# Patient Record
Sex: Female | Born: 1980 | Race: White | Hispanic: No | Marital: Married | Smoking: Former smoker
Health system: Southern US, Community
[De-identification: ages and names within clinical notes are randomized; demographics above are authoritative.]

## PROBLEM LIST (undated history)

## (undated) DIAGNOSIS — Z87442 Personal history of urinary calculi: Secondary | ICD-10-CM

## (undated) DIAGNOSIS — F410 Panic disorder [episodic paroxysmal anxiety] without agoraphobia: Secondary | ICD-10-CM

## (undated) DIAGNOSIS — IMO0002 Reserved for concepts with insufficient information to code with codable children: Secondary | ICD-10-CM

## (undated) DIAGNOSIS — R87619 Unspecified abnormal cytological findings in specimens from cervix uteri: Secondary | ICD-10-CM

## (undated) DIAGNOSIS — Z803 Family history of malignant neoplasm of breast: Secondary | ICD-10-CM

## (undated) DIAGNOSIS — R159 Full incontinence of feces: Secondary | ICD-10-CM

## (undated) HISTORY — DX: Unspecified abnormal cytological findings in specimens from cervix uteri: R87.619

## (undated) HISTORY — DX: Family history of malignant neoplasm of breast: Z80.3

## (undated) HISTORY — PX: THORACIC OUTLET SURGERY: SHX2502

## (undated) HISTORY — DX: Reserved for concepts with insufficient information to code with codable children: IMO0002

## (undated) HISTORY — PX: OTHER SURGICAL HISTORY: SHX169

## (undated) HISTORY — DX: Personal history of urinary calculi: Z87.442

## (undated) HISTORY — DX: Full incontinence of feces: R15.9

## (undated) HISTORY — DX: Panic disorder (episodic paroxysmal anxiety): F41.0

## (undated) HISTORY — PX: SHOULDER SURGERY: SHX246

---

## 2002-10-11 HISTORY — PX: SHOULDER OPEN ROTATOR CUFF REPAIR: SHX2407

## 2004-10-11 HISTORY — PX: OTHER SURGICAL HISTORY: SHX169

## 2009-11-20 ENCOUNTER — Emergency Department (HOSPITAL_COMMUNITY): Admission: EM | Admit: 2009-11-20 | Discharge: 2009-11-20 | Payer: Self-pay | Admitting: Emergency Medicine

## 2010-09-09 ENCOUNTER — Ambulatory Visit: Payer: Self-pay | Admitting: Obstetrics & Gynecology

## 2010-09-09 LAB — CONVERTED CEMR LAB
Antibody Screen: NEGATIVE
Basophils Absolute: 0 10*3/uL (ref 0.0–0.1)
Basophils Relative: 0 % (ref 0–1)
Eosinophils Absolute: 0 10*3/uL (ref 0.0–0.7)
Eosinophils Relative: 0 % (ref 0–5)
HCT: 36.5 % (ref 36.0–46.0)
HIV: NONREACTIVE
Hemoglobin: 12.2 g/dL (ref 12.0–15.0)
Hepatitis B Surface Ag: NEGATIVE
Lymphocytes Relative: 22 % (ref 12–46)
Lymphs Abs: 1.6 10*3/uL (ref 0.7–4.0)
MCHC: 33.4 g/dL (ref 30.0–36.0)
MCV: 92.9 fL (ref 78.0–100.0)
Monocytes Absolute: 0.5 10*3/uL (ref 0.1–1.0)
Monocytes Relative: 7 % (ref 3–12)
Neutro Abs: 4.9 10*3/uL (ref 1.7–7.7)
Neutrophils Relative %: 70 % (ref 43–77)
Platelets: 180 10*3/uL (ref 150–400)
RBC: 3.93 M/uL (ref 3.87–5.11)
RDW: 12.8 % (ref 11.5–15.5)
Rh Type: POSITIVE
Rubella: 27.1 intl units/mL — ABNORMAL HIGH
WBC: 7 10*3/uL (ref 4.0–10.5)

## 2010-09-09 LAB — CBC
HCT: 36 % (ref 36–46)
Hemoglobin: 12.2 g/dL (ref 12.0–16.0)

## 2010-09-16 ENCOUNTER — Ambulatory Visit (HOSPITAL_COMMUNITY)
Admission: RE | Admit: 2010-09-16 | Discharge: 2010-09-16 | Payer: Self-pay | Source: Home / Self Care | Attending: Family Medicine | Admitting: Family Medicine

## 2010-10-01 ENCOUNTER — Ambulatory Visit: Payer: Self-pay | Admitting: Obstetrics & Gynecology

## 2010-10-01 ENCOUNTER — Encounter: Payer: Self-pay | Admitting: Obstetrics & Gynecology

## 2010-10-01 LAB — CONVERTED CEMR LAB: TSH: 1.26 microintl units/mL (ref 0.350–4.500)

## 2010-10-26 ENCOUNTER — Ambulatory Visit (HOSPITAL_COMMUNITY)
Admission: RE | Admit: 2010-10-26 | Discharge: 2010-10-26 | Payer: Self-pay | Source: Home / Self Care | Attending: Family Medicine | Admitting: Family Medicine

## 2010-10-29 ENCOUNTER — Ambulatory Visit
Admission: RE | Admit: 2010-10-29 | Discharge: 2010-10-29 | Payer: Self-pay | Source: Home / Self Care | Attending: Obstetrics and Gynecology | Admitting: Obstetrics and Gynecology

## 2010-10-29 ENCOUNTER — Other Ambulatory Visit (HOSPITAL_COMMUNITY): Payer: Self-pay | Admitting: *Deleted

## 2010-10-29 DIAGNOSIS — Z3689 Encounter for other specified antenatal screening: Secondary | ICD-10-CM

## 2010-11-25 ENCOUNTER — Ambulatory Visit (HOSPITAL_COMMUNITY): Admission: RE | Admit: 2010-11-25 | Payer: Self-pay | Source: Home / Self Care | Admitting: Family Medicine

## 2010-11-25 ENCOUNTER — Ambulatory Visit (HOSPITAL_COMMUNITY)
Admission: RE | Admit: 2010-11-25 | Discharge: 2010-11-25 | Disposition: A | Discharge status: Home / Self Care | Payer: Commercial Managed Care - PPO | Source: Ambulatory Visit | Attending: Family Medicine | Admitting: Family Medicine

## 2010-11-25 DIAGNOSIS — Z363 Encounter for antenatal screening for malformations: Secondary | ICD-10-CM | POA: Insufficient documentation

## 2010-11-25 DIAGNOSIS — Z3689 Encounter for other specified antenatal screening: Secondary | ICD-10-CM

## 2010-11-25 DIAGNOSIS — O358XX0 Maternal care for other (suspected) fetal abnormality and damage, not applicable or unspecified: Secondary | ICD-10-CM | POA: Insufficient documentation

## 2010-11-25 DIAGNOSIS — Z1389 Encounter for screening for other disorder: Secondary | ICD-10-CM | POA: Insufficient documentation

## 2010-11-26 ENCOUNTER — Encounter: Payer: Commercial Managed Care - PPO | Admitting: Obstetrics & Gynecology

## 2010-11-26 DIAGNOSIS — Z3480 Encounter for supervision of other normal pregnancy, unspecified trimester: Secondary | ICD-10-CM

## 2010-12-23 ENCOUNTER — Encounter: Payer: Commercial Managed Care - PPO | Admitting: Obstetrics & Gynecology

## 2010-12-23 DIAGNOSIS — Z3480 Encounter for supervision of other normal pregnancy, unspecified trimester: Secondary | ICD-10-CM

## 2011-01-19 ENCOUNTER — Encounter: Payer: Commercial Managed Care - PPO | Admitting: Obstetrics and Gynecology

## 2011-01-19 DIAGNOSIS — Z3480 Encounter for supervision of other normal pregnancy, unspecified trimester: Secondary | ICD-10-CM

## 2011-01-19 DIAGNOSIS — O99340 Other mental disorders complicating pregnancy, unspecified trimester: Secondary | ICD-10-CM

## 2011-02-09 ENCOUNTER — Encounter (INDEPENDENT_AMBULATORY_CARE_PROVIDER_SITE_OTHER): Payer: 59 | Admitting: Obstetrics and Gynecology

## 2011-02-09 DIAGNOSIS — Z3480 Encounter for supervision of other normal pregnancy, unspecified trimester: Secondary | ICD-10-CM

## 2011-02-09 DIAGNOSIS — Z87442 Personal history of urinary calculi: Secondary | ICD-10-CM

## 2011-02-09 DIAGNOSIS — O99340 Other mental disorders complicating pregnancy, unspecified trimester: Secondary | ICD-10-CM

## 2011-02-09 HISTORY — DX: Personal history of urinary calculi: Z87.442

## 2011-02-10 ENCOUNTER — Encounter: Payer: Commercial Managed Care - PPO | Admitting: Obstetrics & Gynecology

## 2011-02-15 ENCOUNTER — Inpatient Hospital Stay (HOSPITAL_COMMUNITY)
Admission: AD | Admit: 2011-02-15 | Discharge: 2011-02-15 | Disposition: A | Discharge status: Home / Self Care | Payer: 59 | Source: Ambulatory Visit | Attending: Obstetrics and Gynecology | Admitting: Obstetrics and Gynecology

## 2011-02-15 ENCOUNTER — Encounter (HOSPITAL_COMMUNITY): Payer: Self-pay | Admitting: Radiology

## 2011-02-15 ENCOUNTER — Inpatient Hospital Stay (HOSPITAL_COMMUNITY): Payer: 59

## 2011-02-15 ENCOUNTER — Other Ambulatory Visit (INDEPENDENT_AMBULATORY_CARE_PROVIDER_SITE_OTHER): Payer: 59

## 2011-02-15 DIAGNOSIS — O2390 Unspecified genitourinary tract infection in pregnancy, unspecified trimester: Secondary | ICD-10-CM | POA: Insufficient documentation

## 2011-02-15 DIAGNOSIS — N390 Urinary tract infection, site not specified: Secondary | ICD-10-CM

## 2011-02-15 DIAGNOSIS — M549 Dorsalgia, unspecified: Secondary | ICD-10-CM

## 2011-02-15 DIAGNOSIS — Z3480 Encounter for supervision of other normal pregnancy, unspecified trimester: Secondary | ICD-10-CM

## 2011-02-15 DIAGNOSIS — R319 Hematuria, unspecified: Secondary | ICD-10-CM

## 2011-02-15 LAB — URINE MICROSCOPIC-ADD ON

## 2011-02-15 LAB — URINALYSIS, ROUTINE W REFLEX MICROSCOPIC
Bilirubin Urine: NEGATIVE
Glucose, UA: NEGATIVE mg/dL
Ketones, ur: NEGATIVE mg/dL
Nitrite: NEGATIVE
Protein, ur: NEGATIVE mg/dL
Specific Gravity, Urine: 1.02 (ref 1.005–1.030)
Urobilinogen, UA: 0.2 mg/dL (ref 0.0–1.0)
pH: 6 (ref 5.0–8.0)

## 2011-02-15 LAB — WET PREP, GENITAL
Trich, Wet Prep: NONE SEEN
Yeast Wet Prep HPF POC: NONE SEEN

## 2011-02-23 DIAGNOSIS — N200 Calculus of kidney: Secondary | ICD-10-CM | POA: Insufficient documentation

## 2011-03-02 ENCOUNTER — Encounter (INDEPENDENT_AMBULATORY_CARE_PROVIDER_SITE_OTHER): Payer: 59 | Admitting: Obstetrics & Gynecology

## 2011-03-02 DIAGNOSIS — O99340 Other mental disorders complicating pregnancy, unspecified trimester: Secondary | ICD-10-CM

## 2011-03-02 DIAGNOSIS — Z3480 Encounter for supervision of other normal pregnancy, unspecified trimester: Secondary | ICD-10-CM

## 2011-03-10 ENCOUNTER — Encounter: Payer: 59 | Admitting: Obstetrics & Gynecology

## 2011-03-23 ENCOUNTER — Other Ambulatory Visit: Payer: Self-pay | Admitting: Family Medicine

## 2011-03-23 ENCOUNTER — Encounter (INDEPENDENT_AMBULATORY_CARE_PROVIDER_SITE_OTHER): Payer: 59 | Admitting: Family Medicine

## 2011-03-23 DIAGNOSIS — Z3480 Encounter for supervision of other normal pregnancy, unspecified trimester: Secondary | ICD-10-CM

## 2011-03-29 ENCOUNTER — Ambulatory Visit (HOSPITAL_COMMUNITY)
Admission: RE | Admit: 2011-03-29 | Discharge: 2011-03-29 | Disposition: A | Discharge status: Home / Self Care | Payer: 59 | Source: Ambulatory Visit | Attending: Family Medicine | Admitting: Family Medicine

## 2011-03-29 DIAGNOSIS — O365990 Maternal care for other known or suspected poor fetal growth, unspecified trimester, not applicable or unspecified: Secondary | ICD-10-CM | POA: Insufficient documentation

## 2011-03-29 DIAGNOSIS — Z3689 Encounter for other specified antenatal screening: Secondary | ICD-10-CM | POA: Insufficient documentation

## 2011-03-30 ENCOUNTER — Encounter (INDEPENDENT_AMBULATORY_CARE_PROVIDER_SITE_OTHER): Payer: 59 | Admitting: Obstetrics & Gynecology

## 2011-03-30 DIAGNOSIS — O99340 Other mental disorders complicating pregnancy, unspecified trimester: Secondary | ICD-10-CM

## 2011-03-30 DIAGNOSIS — Z3480 Encounter for supervision of other normal pregnancy, unspecified trimester: Secondary | ICD-10-CM

## 2011-04-06 ENCOUNTER — Encounter (HOSPITAL_COMMUNITY)
Admission: RE | Admit: 2011-04-06 | Discharge: 2011-04-06 | Disposition: A | Discharge status: Home / Self Care | Payer: 59 | Source: Ambulatory Visit | Attending: Obstetrics and Gynecology | Admitting: Obstetrics and Gynecology

## 2011-04-06 LAB — CBC
HCT: 37.4 % (ref 36.0–46.0)
MCH: 33.6 pg (ref 26.0–34.0)
MCV: 100.5 fL — ABNORMAL HIGH (ref 78.0–100.0)
Platelets: 146 10*3/uL — ABNORMAL LOW (ref 150–400)
RDW: 15.5 % (ref 11.5–15.5)

## 2011-04-07 ENCOUNTER — Encounter (INDEPENDENT_AMBULATORY_CARE_PROVIDER_SITE_OTHER): Payer: 59 | Admitting: Obstetrics & Gynecology

## 2011-04-07 DIAGNOSIS — Z3480 Encounter for supervision of other normal pregnancy, unspecified trimester: Secondary | ICD-10-CM

## 2011-04-07 DIAGNOSIS — O99340 Other mental disorders complicating pregnancy, unspecified trimester: Secondary | ICD-10-CM

## 2011-04-15 ENCOUNTER — Encounter (INDEPENDENT_AMBULATORY_CARE_PROVIDER_SITE_OTHER): Payer: 59 | Admitting: Family Medicine

## 2011-04-15 DIAGNOSIS — Z3480 Encounter for supervision of other normal pregnancy, unspecified trimester: Secondary | ICD-10-CM

## 2011-04-16 ENCOUNTER — Inpatient Hospital Stay (HOSPITAL_COMMUNITY)
Admission: RE | Admit: 2011-04-16 | Discharge: 2011-04-18 | DRG: 766 | Disposition: A | Discharge status: Home / Self Care | Payer: 59 | Source: Ambulatory Visit | Attending: Family Medicine | Admitting: Family Medicine

## 2011-04-16 ENCOUNTER — Inpatient Hospital Stay (HOSPITAL_COMMUNITY): Payer: 59 | Admitting: Obstetrics and Gynecology

## 2011-04-16 ENCOUNTER — Other Ambulatory Visit: Payer: Self-pay | Admitting: Obstetrics and Gynecology

## 2011-04-16 DIAGNOSIS — Z302 Encounter for sterilization: Secondary | ICD-10-CM

## 2011-04-16 DIAGNOSIS — O9989 Other specified diseases and conditions complicating pregnancy, childbirth and the puerperium: Secondary | ICD-10-CM

## 2011-04-16 DIAGNOSIS — Z01818 Encounter for other preprocedural examination: Secondary | ICD-10-CM

## 2011-04-16 DIAGNOSIS — Z01812 Encounter for preprocedural laboratory examination: Secondary | ICD-10-CM

## 2011-04-16 DIAGNOSIS — Z3490 Encounter for supervision of normal pregnancy, unspecified, unspecified trimester: Secondary | ICD-10-CM

## 2011-04-16 DIAGNOSIS — O99892 Other specified diseases and conditions complicating childbirth: Principal | ICD-10-CM | POA: Diagnosis present

## 2011-04-16 LAB — TYPE AND SCREEN

## 2011-04-16 LAB — ABO/RH: ABO/RH(D): B POS

## 2011-04-16 MED ORDER — SENNOSIDES-DOCUSATE SODIUM 8.6-50 MG PO TABS
1.0000 | ORAL_TABLET | Freq: Every day | ORAL | Status: DC
  Administered 2011-04-17: 2 via ORAL
  Filled 2011-04-16: qty 2

## 2011-04-16 MED ORDER — DIPHENHYDRAMINE HCL 25 MG PO CAPS: 25.0000 mg | ORAL_CAPSULE | Freq: Four times a day (QID) | ORAL | Status: DC | PRN

## 2011-04-16 MED ORDER — CALCIUM CARBONATE ANTACID 500 MG PO CHEW: 1.0000 | CHEWABLE_TABLET | ORAL | Status: DC | PRN

## 2011-04-16 MED ORDER — SIMETHICONE 80 MG PO CHEW
80.0000 mg | CHEWABLE_TABLET | Freq: Three times a day (TID) | ORAL | Status: DC
  Administered 2011-04-17 – 2011-04-18 (×5): 80 mg via ORAL
  Filled 2011-04-16 (×6): qty 1

## 2011-04-16 MED ORDER — HYDROCORTISONE 1 % EX CREA
TOPICAL_CREAM | Freq: Four times a day (QID) | CUTANEOUS | Status: DC | PRN
  Filled 2011-04-16: qty 28

## 2011-04-16 MED ORDER — MENTHOL 3 MG MT LOZG: 1.0000 | LOZENGE | OROMUCOSAL | Status: DC | PRN

## 2011-04-16 MED ORDER — NALBUPHINE HCL 10 MG/ML IJ SOLN
5.0000 mg | INTRAMUSCULAR | Status: DC | PRN
  Filled 2011-04-16: qty 1

## 2011-04-16 MED ORDER — SODIUM CHLORIDE 0.9 % IJ SOLN
3.0000 mL | Freq: Two times a day (BID) | INTRAMUSCULAR | Status: DC
  Filled 2011-04-16 (×3): qty 3

## 2011-04-16 MED ORDER — BISACODYL 10 MG RE SUPP: 10.0000 mg | Freq: Every day | RECTAL | Status: DC | PRN

## 2011-04-16 MED ORDER — DIBUCAINE 1 % RE OINT: TOPICAL_OINTMENT | RECTAL | Status: DC | PRN

## 2011-04-16 MED ORDER — HYDROCORTISONE 1 % EX OINT
TOPICAL_OINTMENT | Freq: Four times a day (QID) | CUTANEOUS | Status: DC | PRN
  Filled 2011-04-16: qty 28.35

## 2011-04-16 MED ORDER — PRAMOXINE HCL 1 % RE FOAM
RECTAL | Status: DC | PRN
  Filled 2011-04-16: qty 15

## 2011-04-16 MED ORDER — NALOXONE HCL 0.4 MG/ML IJ SOLN: 0.1000 mg | INTRAMUSCULAR | Status: DC | PRN

## 2011-04-16 MED ORDER — FLEET ENEMA 7-19 GM/118ML RE ENEM
1.0000 | ENEMA | RECTAL | Status: DC | PRN
  Filled 2011-04-16: qty 1

## 2011-04-16 MED ORDER — IBUPROFEN 600 MG PO TABS
600.0000 mg | ORAL_TABLET | Freq: Four times a day (QID) | ORAL | Status: DC
  Administered 2011-04-17 – 2011-04-18 (×6): 600 mg via ORAL
  Filled 2011-04-16 (×5): qty 1

## 2011-04-16 MED ORDER — DIPHENHYDRAMINE HCL 25 MG PO CAPS: 25.0000 mg | ORAL_CAPSULE | ORAL | Status: DC | PRN

## 2011-04-16 MED ORDER — SIMETHICONE 80 MG PO CHEW
80.0000 mg | CHEWABLE_TABLET | ORAL | Status: DC | PRN
  Filled 2011-04-16: qty 1

## 2011-04-16 MED ORDER — GUAIFENESIN 100 MG/5ML PO SOLN: 15.0000 mL | ORAL | Status: DC | PRN

## 2011-04-16 MED ORDER — ONDANSETRON HCL 4 MG/2ML IJ SOLN: 4.0000 mg | INTRAMUSCULAR | Status: DC | PRN

## 2011-04-16 MED ORDER — OXYCODONE-ACETAMINOPHEN 5-325 MG PO TABS
1.0000 | ORAL_TABLET | ORAL | Status: DC | PRN
  Administered 2011-04-17 – 2011-04-18 (×5): 1 via ORAL
  Filled 2011-04-16 (×5): qty 1

## 2011-04-16 MED ORDER — DIPHENHYDRAMINE HCL 50 MG/ML IJ SOLN: 25.0000 mg | INTRAMUSCULAR | Status: DC | PRN

## 2011-04-16 MED ORDER — WITCH HAZEL-GLYCERIN EX PADS
MEDICATED_PAD | CUTANEOUS | Status: DC | PRN
  Filled 2011-04-16: qty 100

## 2011-04-16 MED ORDER — PSEUDOEPHEDRINE HCL 30 MG PO TABS: 60.0000 mg | ORAL_TABLET | ORAL | Status: DC | PRN

## 2011-04-16 MED ORDER — NALOXONE HCL 0.4 MG/ML IJ SOLN: 1.0000 ug/kg/h | INTRAVENOUS | Status: DC

## 2011-04-16 MED ORDER — MAGNESIUM HYDROXIDE 400 MG/5ML PO SUSP: 30.0000 mL | ORAL | Status: DC | PRN

## 2011-04-16 MED ORDER — KETOROLAC TROMETHAMINE 30 MG/ML IJ SOLN: 30.0000 mg | INTRAMUSCULAR | Status: DC | PRN

## 2011-04-16 MED ORDER — ONDANSETRON HCL 4 MG PO TABS: 4.0000 mg | ORAL_TABLET | ORAL | Status: DC | PRN

## 2011-04-16 MED ORDER — DIPHENHYDRAMINE HCL 50 MG/ML IJ SOLN: 12.5000 mg | INTRAMUSCULAR | Status: DC | PRN

## 2011-04-16 MED ORDER — ZOLPIDEM TARTRATE 5 MG PO TABS: 5.0000 mg | ORAL_TABLET | Freq: Every evening | ORAL | Status: DC | PRN

## 2011-04-16 MED ORDER — SODIUM CHLORIDE 0.9 % IJ SOLN
3.0000 mL | INTRAMUSCULAR | Status: DC | PRN
  Filled 2011-04-16: qty 3

## 2011-04-16 MED ORDER — LACTATED RINGERS IV SOLN: INTRAVENOUS | Status: DC

## 2011-04-17 ENCOUNTER — Encounter (HOSPITAL_COMMUNITY): Payer: Self-pay | Admitting: *Deleted

## 2011-04-17 LAB — CBC
Hemoglobin: 9.9 g/dL — ABNORMAL LOW (ref 12.0–15.0)
MCH: 33.4 pg (ref 26.0–34.0)
MCV: 100.7 fL — ABNORMAL HIGH (ref 78.0–100.0)
Platelets: 134 10*3/uL — ABNORMAL LOW (ref 150–400)
RBC: 2.96 MIL/uL — ABNORMAL LOW (ref 3.87–5.11)

## 2011-04-17 NOTE — Progress Notes (Signed)
Post-Op Day 1 S/P Primary C-Section & Bilateral Tubal Ligation   Subjective: up ad lib, voiding, tolerating PO and + flatus.  +breast/bottle feeding.  No questions or concerns.  Pain controlled with pain meds.    Objective: Blood pressure 97/62, pulse 70, temperature 97.8 F (36.6 C), temperature source Oral, resp. rate 18, SpO2 99.00%.  Physical Exam:  General: alert and cooperative Lochia: appropriate Uterine Fundus: firm Incision: no significant drainage; dressing clean, dry and intact. DVT Evaluation: Negative Homan's sign.   Basename 04/17/11 0500  HGB 9.9*  HCT 29.8*    Assessment/Plan: POD#1 - Stable  Continue Postpartum Care Remove dressing Encourage Ambulation   LOS: 1 day   [facility] 04/17/2011, 8:48 AM

## 2011-04-18 DIAGNOSIS — Z3490 Encounter for supervision of normal pregnancy, unspecified, unspecified trimester: Secondary | ICD-10-CM

## 2011-04-18 MED ORDER — SENNOSIDES-DOCUSATE SODIUM 8.6-50 MG PO TABS: 1.0000 | ORAL_TABLET | Freq: Every day | ORAL | Status: AC

## 2011-04-18 MED ORDER — IBUPROFEN 600 MG PO TABS: 600.0000 mg | ORAL_TABLET | Freq: Four times a day (QID) | ORAL | Status: AC

## 2011-04-18 MED ORDER — OXYCODONE-ACETAMINOPHEN 5-325 MG PO TABS: 1.0000 | ORAL_TABLET | ORAL | Status: AC | PRN

## 2011-04-18 NOTE — Discharge Summary (Signed)
Obstetric Discharge Summary Reason for Admission:  Elective cesarean section secondary to history of 4th degree laceration.   Prenatal Procedures: ultrasound Intrapartum Procedures: cesarean: low cervical, transverse and tubal ligation Postpartum Procedures: P.P. tubal ligation Complications-Operative and Postpartum: none  Hemoglobin  Date Value Range Status  04/17/2011 9.9* 12.0-15.0 (g/dL) Final     HCT  Date Value Range Status  04/17/2011 29.8* 36.0-46.0 (%) Final    Discharge Diagnoses: Term Pregnancy-delivered and Tubal Ligation and Anemia (stable)  Discharge Information: Date: 04/18/2011 Activity: pelvic rest Diet: routine Medications: Ibuprophen, Colace, Iron and Percocet Condition: stable Instructions: Refer to information provided. Discharge to: home Follow-up Information    Make an appointment to follow up. (Staple Removal on 04/20/11)          Newborn Data: Live born  Information for the patient's newborn:  Montie, Gelardi [[id number]]  female  Home with mother.  [facility] 04/18/2011, 9:57 AM

## 2011-04-18 NOTE — Progress Notes (Signed)
Post-Op Day 2 S/P Primary C-Section & Bilateral Tubal Ligation   Subjective: up ad lib, voiding, tolerating PO and + flatus.  +breast/bottle feeding.  No questions or concerns.  Pain controlled with pain meds.  Pt desires discharge home.    Objective: Blood pressure 104/65, pulse 67, temperature 98.1 F (36.7 C), temperature source Oral, resp. rate 18, SpO2 99.00%.  Physical Exam:  General: alert and cooperative Lochia: appropriate Uterine Fundus: firm Incision: no significant drainage; well approximated; no signs of infection.   DVT Evaluation: Negative Homan's sign.   Basename 04/17/11 0500  HGB 9.9*  HCT 29.8*    Assessment/Plan: POD#2- Stable  Discharge Home Reviewed Postpartum Instructions Follow-up in office in 2 days for staple removal RX - Percocet, Ibuprofen, Colace, Integra   LOS: 2 days   [facility] 04/18/2011, 9:26 AM

## 2011-04-18 NOTE — Progress Notes (Signed)
Mother is having problems latching, has used nipple shield but has not been helpful. Baby unable to maintain latch. Mother plans to pump and bottle. Encouraged to call for appt.

## 2011-04-21 ENCOUNTER — Ambulatory Visit (INDEPENDENT_AMBULATORY_CARE_PROVIDER_SITE_OTHER): Payer: 59 | Admitting: *Deleted

## 2011-04-21 DIAGNOSIS — Z09 Encounter for follow-up examination after completed treatment for conditions other than malignant neoplasm: Secondary | ICD-10-CM

## 2011-04-21 NOTE — Patient Instructions (Signed)
Advised patient to return to [facility] for evaluation of severe headaches.

## 2011-04-21 NOTE — Op Note (Signed)
Danielle Schneider, Danielle Schneider               ACCOUNT NO.:  [id number]  MEDICAL RECORD NO.:  [id number]  LOCATION:  9101                          FACILITY:  [facility]  PHYSICIAN:  Catalina Antigua, MD     DATE OF BIRTH:  1980/11/26  DATE OF PROCEDURE:  04/16/2011 DATE OF DISCHARGE:                              OPERATIVE REPORT   PREOPERATIVE DIAGNOSES:  A 30 year old G3, P2 at 54 weeks with history of fourth degree laceration and undesired fertility.  POSTOPERATIVE DIAGNOSES:  A 30 year old G3, P2 at 40 weeks with history of fourth degree laceration and undesired fertility.  PROCEDURE:  Primary low transverse C-section, bilateral tubal ligation.  SURGEON:  Catalina Antigua, MD  ASSISTANT:  None.  ANESTHESIA:  Spinal.  IV FLUIDS:  1 liter.  URINE OUTPUT:  300 mL.  ESTIMATED BLOOD LOSS:  800 mL.  COMPLICATIONS:  None.  FINDINGS:  A viable female infant with Apgars 8 and 9, clear fluid, weight of 7 pounds 4 ounces.  Grossly normal uterus, tubes, and ovaries x2.  SPECIMEN COLLECTED:  Placenta was sent to L and D and portions of right and left fallopian tubes sent to Pathology.  After informed consent was obtained, the patient was taken to the operating room where anesthesia was induced and found to be adequate. The patient was placed in dorsal supine position with leftward tilt and prepped and draped in usual sterile fashion.  A Pfannenstiel skin incision was made with a scalpel and carried down to an underlying layer of fascia with the Bovie.  The fascia was incised in midline, incision extended laterally with a Mayo scissors.  The inferior aspect of fascial incision was grasped with Kocher clamps, tented up, and the underlying rectus muscle was dissected off.  Attention was then turned to superior aspect of fascial incision which in similar fashion was grasped, tented up with Kocher clamps, and the underlying rectus muscle dissected off the rectus muscle was separated in midline.  It was  identified, tented up, and entered bluntly.  This incision was extended superiorly and inferiorly with good visualization of the bladder.  The lower uterine segment was incised and the vesicouterine peritoneum was identified, tented up, and entered sharply with Metzenbaum scissors.  This incision was extended laterally and the bladder flap created digitally.  The lower uterine segment was incised in a transverse fashion with a scalpel and this incision was extended laterally with the bandage scissors. Nose and mouth were bulb suctioned.  The cord clamped and cut.  The infant was handed to the awaiting pediatrician.  The placenta was delivered via gentle uterine massage.  The uterus was cleared of all clots and debris.  The uterine incision was repaired with 0 Vicryl in a running locked fashion.  Excellent hemostasis was noted.  Copious irrigation of the abdomen was then performed.  The gutters were cleared of all clots and debris.  The left fallopian tube was identified, grasped with Babcock clamp, and a approximately 2-cm segment was sutureligated x2 and transected.  Excellent hemostasis was noted.  Similarly, the right fallopian tube was grasped with Babcock clamp, identified to its fimbriated end, suture ligated x2, and a approximately 2-cm segment was transected.  Again hemostasis was visualized.  A slow oozing was visualized at the tear of the uterine incision.  Surgicel was applied to the entire length of the uterine incision to ensure persistent hemostasis.  The fascia was reapproximated with 0 Vicryl and the skin was closed with staples.  The patient tolerated procedure well.  Sponge, lap, and needle count were correct x2.     Catalina Antigua, MD     PC/MEDQ  D:  04/16/2011  T:  04/17/2011  Job:  [id number]  Electronically Signed by Catalina Antigua  on 04/21/2011 01:04:02 PM

## 2011-04-21 NOTE — Progress Notes (Signed)
Removed staples and applied steristrips to lower abdomen c-section site.  Small area of bleeding at right side of incision.  Patient was reassured and instructed on cleaning her incision.  She also complains of severe headache since delivery that is relieved when she lays flat for any amount of time.  Advised her to return to [facility] for evaluation.

## 2011-05-26 DIAGNOSIS — N200 Calculus of kidney: Secondary | ICD-10-CM

## 2011-05-26 DIAGNOSIS — O99340 Other mental disorders complicating pregnancy, unspecified trimester: Secondary | ICD-10-CM

## 2011-05-26 DIAGNOSIS — F419 Anxiety disorder, unspecified: Secondary | ICD-10-CM | POA: Insufficient documentation

## 2011-05-27 ENCOUNTER — Ambulatory Visit (INDEPENDENT_AMBULATORY_CARE_PROVIDER_SITE_OTHER): Payer: 59 | Admitting: Obstetrics & Gynecology

## 2011-05-27 ENCOUNTER — Encounter: Payer: Self-pay | Admitting: Obstetrics & Gynecology

## 2011-05-27 DIAGNOSIS — Z09 Encounter for follow-up examination after completed treatment for conditions other than malignant neoplasm: Secondary | ICD-10-CM

## 2011-05-27 NOTE — Progress Notes (Signed)
  Subjective:    Patient ID: Danielle Schneider, female    DOB: 1981/04/27, 30 y.o.   MRN: [id number]  HPI  Post partum exam:  No problems with depression, breast fed for four weeks, now bottle feeding, baby growing well, no sex yet, normal bowel and bladder function.  Review of Systems    pap due 12/12 Objective:   Physical Exam   Incision healed well     Assessment & Plan:  Post partum-stable Annual in 12/12

## 2011-11-25 ENCOUNTER — Ambulatory Visit: Payer: 59 | Admitting: Obstetrics & Gynecology

## 2011-12-02 NOTE — Progress Notes (Signed)
Encounter addended by: Reva Bores, MD on: 12/02/2011 11:51 AM<BR>     Documentation filed: Notes Section

## 2011-12-23 ENCOUNTER — Encounter (HOSPITAL_COMMUNITY): Payer: Self-pay | Admitting: *Deleted

## 2011-12-23 ENCOUNTER — Emergency Department (HOSPITAL_COMMUNITY)
Admission: EM | Admit: 2011-12-23 | Discharge: 2011-12-23 | Disposition: A | Discharge status: Home / Self Care | Payer: 59 | Source: Home / Self Care | Attending: Emergency Medicine | Admitting: Emergency Medicine

## 2011-12-23 ENCOUNTER — Emergency Department (INDEPENDENT_AMBULATORY_CARE_PROVIDER_SITE_OTHER): Payer: 59

## 2011-12-23 DIAGNOSIS — M25559 Pain in unspecified hip: Secondary | ICD-10-CM

## 2011-12-23 DIAGNOSIS — M5430 Sciatica, unspecified side: Secondary | ICD-10-CM

## 2011-12-23 DIAGNOSIS — M25551 Pain in right hip: Secondary | ICD-10-CM

## 2011-12-23 MED ORDER — MELOXICAM 7.5 MG PO TABS: 7.5000 mg | ORAL_TABLET | Freq: Every day | ORAL | Status: DC

## 2011-12-23 NOTE — ED Notes (Signed)
Right hip pain that started before 10/05/11.  Could not cross legs but now has to keep leg straight.  Feels like "sitting on bone" Pain radiates down to knee on the front and back of right leg.  Tingling sensation on toes when driving.  Pain increases upon exertion.  Pain is relieved when on knees.  Had C-section on 04/16/11 and has discomfort since then on right hip but became painful around 10/05/11.

## 2011-12-23 NOTE — Discharge Instructions (Signed)
Today we have discussed your ongoing symptoms physical exam, and x-ray results. Have recommended you followup with the orthopedic provider as there are many diagnostic possibilities we have excluded today many entities that would have required further intervention. Take his medicines as prescribed, initially for 2 weeks and if complete resolution or marked improvement could discontinue the rest of treatment if persistent pain complete 30 day treatment course I also believe you should let you know your provider if pain and discomfort persists or any further changes as further imaging studies might be necessary.

## 2011-12-23 NOTE — ED Provider Notes (Signed)
History     CSN: [id number]  Arrival date & time 12/23/11  7829   First MD Initiated Contact with Patient 12/23/11 1000      Chief Complaint  Patient presents with  . Hip Pain    (Consider location/radiation/quality/duration/timing/severity/associated sxs/prior treatment) HPI Comments: Patient presents urgent care complaining of ongoing right-sided hip pain (points to upper anterior aspect of right tigh and lateral aspect) also describes it she feels like she's location sitting on a bone points to posterior aspect of right buttock. She feels and certain activities or after long hours a tingling sensation that goes all way down to her toes especially when driving. It's very painful to raise her leg up from a laying down position.  Patient describes that in December she recalls no specific falls or injuries but does recall that she couldn't cross the legs because of the pain on her right hip. Has been wondering if that has been related to her C-section. Patient denies any urinary symptoms or perineal numbness or tingling, denies any muscular weakness, denies any weight loss or ongoing fevers.  Patient has discuss with primary care Dr.  Patient is a 31 y.o. female presenting with hip pain. The history is provided by the patient.  Hip Pain This is a recurrent problem. The current episode started more than 1 week ago. The problem has been gradually worsening. Pertinent negatives include no abdominal pain. The symptoms are aggravated by bending and walking.    Past Medical History  Diagnosis Date  . History of nephrolithiasis 02/2011  . Abnormal Pap smear   . Anal sphincter incontinence after fourth degree tear    resolved as of 2012  . Panic attack     history of    Past Surgical History  Procedure Date  . Shoulder open rotator cuff repair 2004  . Rib cage 2006    rib cage bone removal  . Cesarean section   . Post partum sterilization   . Shoulder surgery   . Thoracic outlet  surgery     History reviewed. No pertinent family history.  History  Substance Use Topics  . Smoking status: Former Smoker    Quit date: 04/16/2008  . Smokeless tobacco: Never Used  . Alcohol Use: No    OB History    Grav Para Term Preterm Abortions TAB SAB Ect Mult Living   3 3 3       3       Review of Systems  Constitutional: Negative for fever, diaphoresis, appetite change and fatigue.  Gastrointestinal: Negative for abdominal pain.  Genitourinary: Negative for dysuria, urgency, frequency, flank pain, enuresis and difficulty urinating.  Musculoskeletal: Positive for gait problem. Negative for back pain and arthralgias.  Neurological: Positive for numbness. Negative for weakness.    Allergies  Vicodin  Home Medications   Current Outpatient Rx  Name Route Sig Dispense Refill  . ALBUTEROL IN Inhalation Inhale 2 puffs into the lungs 4 (four) times a week. Inhale 4 to 5 times weekly     . VITAMIN C 1000 MG PO TABS Oral Take 1,000 mg by mouth daily.      Marland Kitchen VITAMIN B-6 100 MG PO TABS Oral Take 100 mg by mouth daily.      . B COMPLEX-C PO TABS Oral Take 1 tablet by mouth daily.      Marland Kitchen PRENATAL PLUS 27-1 MG PO TABS Oral Take 1 tablet by mouth daily.      Bernadette Hoit SODIUM 8.6-50 MG PO  TABS Oral Take 1 tablet by mouth at bedtime. 30 tablet 0    BP 109/73  Pulse 72  Temp(Src) 97.7 F (36.5 C) (Oral)  Resp 16  SpO2 100%  LMP 12/17/2011  Breastfeeding? No  Physical Exam  Nursing note and vitals reviewed. Constitutional: She appears well-developed and well-nourished.  HENT:  Head: Normocephalic.  Eyes: Conjunctivae are normal.  Neck: Neck supple.  Cardiovascular:  No murmur heard. Musculoskeletal: She exhibits tenderness. She exhibits no edema.       Right hip: She exhibits decreased range of motion and tenderness. She exhibits normal strength, no bony tenderness, no swelling, no deformity and no laceration.       Legs: Lymphadenopathy:    She has no  cervical adenopathy.  Neurological: She is alert. She exhibits normal muscle tone.  Skin: Skin is warm. No rash noted. No erythema.    ED Course  Procedures (including critical care time)  Labs Reviewed - No data to display No results found.   No diagnosis found.    MDM          Jimmie Molly, MD 12/23/11 1240

## 2012-01-27 ENCOUNTER — Encounter: Payer: Self-pay | Admitting: Family Medicine

## 2012-01-27 ENCOUNTER — Ambulatory Visit (INDEPENDENT_AMBULATORY_CARE_PROVIDER_SITE_OTHER): Payer: 59 | Admitting: Family Medicine

## 2012-01-27 VITALS — BP 110/70 | HR 76 | Temp 98.0°F | Wt 183.0 lb

## 2012-01-27 DIAGNOSIS — R5383 Other fatigue: Secondary | ICD-10-CM | POA: Insufficient documentation

## 2012-01-27 DIAGNOSIS — R002 Palpitations: Secondary | ICD-10-CM

## 2012-01-27 DIAGNOSIS — R5381 Other malaise: Secondary | ICD-10-CM

## 2012-01-27 LAB — COMPREHENSIVE METABOLIC PANEL
ALT: 13 U/L (ref 0–35)
AST: 15 U/L (ref 0–37)
Albumin: 4.2 g/dL (ref 3.5–5.2)
Calcium: 8.8 mg/dL (ref 8.4–10.5)
Chloride: 108 mEq/L (ref 96–112)
Potassium: 4 mEq/L (ref 3.5–5.1)
Total Protein: 7 g/dL (ref 6.0–8.3)

## 2012-01-27 LAB — CBC WITH DIFFERENTIAL/PLATELET
Basophils Absolute: 0 10*3/uL (ref 0.0–0.1)
Eosinophils Absolute: 0.1 10*3/uL (ref 0.0–0.7)
Lymphocytes Relative: 33.2 % (ref 12.0–46.0)
MCHC: 33.5 g/dL (ref 30.0–36.0)
Neutro Abs: 3.1 10*3/uL (ref 1.4–7.7)
Neutrophils Relative %: 56.6 % (ref 43.0–77.0)
Platelets: 172 10*3/uL (ref 150.0–400.0)
RDW: 13.8 % (ref 11.5–14.6)

## 2012-01-27 LAB — T4, FREE: Free T4: 0.66 ng/dL (ref 0.60–1.60)

## 2012-01-27 NOTE — Patient Instructions (Signed)
It was good to see you. We will call you with your lab results.   

## 2012-01-27 NOTE — Progress Notes (Signed)
Subjective:    Patient ID: Danielle Schneider, female    DOB: 1981-02-21, 31 y.o.   MRN: [id number]  HPI  31 yo here to establish care.  Palpitations- past three years, h/o intermittent palpitations. Per pt, EKGs always within normal limits. Last several weeks, feels like symptoms are getting worse. Also more fatigued but she works nights. Does not seem to be affected by exertion.  No CP or SOB. Does not feel like it is worsened or triggered by caffeine. Hair is more brittle. Feels more anxious as well. No SI or HI.  H/o depression as a teenager but does not feel depressed now.  Pos FH of CAD- dad had first MI in his 76s.  Patient Active Problem List  Diagnoses  . 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 Palpitations  . Fatigue   Past Medical History  Diagnosis Date  . History of nephrolithiasis 02/2011  . Abnormal Pap smear   . Anal sphincter incontinence after fourth degree tear    resolved as of 2012  . Panic attack     history of   Past Surgical History  Procedure Date  . Shoulder open rotator cuff repair 2004  . Rib cage 2006    rib cage bone removal  . Cesarean section   . Post partum sterilization   . Shoulder surgery   . Thoracic outlet surgery    History  Substance Use Topics  . Smoking status: Former Smoker    Quit date: 04/16/2008  . Smokeless tobacco: Never Used  . Alcohol Use: No   Family History  Problem Relation Age of Onset  . Heart disease Father     MI in 55s   Allergies  Allergen Reactions  . Vicodin (Hydrocodone-Acetaminophen) Other (See Comments)    Nausea, dizziness, cold sweats   Current Outpatient Prescriptions on File Prior to Visit  Medication Sig Dispense Refill  . ALBUTEROL IN Inhale 2 puffs into the lungs 4 (four) times a week.       . Ascorbic Acid (VITAMIN C) 1000 MG tablet Take 1,000 mg by mouth daily.        . B Complex-C (B-COMPLEX WITH VITAMIN C) tablet Take 1 tablet by mouth daily.        . meloxicam (MOBIC) 7.5 MG  tablet Take 1 tablet (7.5 mg total) by mouth daily.  30 tablet  0  . pyridOXINE (VITAMIN B-6) 100 MG tablet Take 100 mg by mouth daily.        Marland Kitchen senna-docusate (SENOKOT-S) 8.6-50 MG per tablet Take 1 tablet by mouth at bedtime.  30 tablet  0   The PMH, PSH, Social History, Family History, Medications, and allergies have been reviewed in [facility], and have been updated if relevant.   Review of Systems See HPI No DOE No HA or blurred vision No cold or heat intolerance    Objective:   Physical Exam BP 110/70  Pulse 76  Temp(Src) 98 F (36.7 C) (Oral)  Wt 183 lb (83.008 kg)  LMP 12/27/2011  General:  Well-developed,well-nourished,in no acute distress; alert,appropriate and cooperative throughout examination Head:  normocephalic and atraumatic.   Eyes:  vision grossly intact, pupils equal, pupils round, and pupils reactive to light.   Ears:  R ear normal and L ear normal.   Nose:  no external deformity.   Mouth:  good dentition.   Neck:  No deformities, masses, or tenderness noted. Lungs:  Normal respiratory effort, chest expands symmetrically. Lungs are clear to auscultation,  no crackles or wheezes. Heart:  Normal rate and regular rhythm. S1 and S2 normal without gallop, murmur, click, rub or other extra sounds. Msk:  No deformity or scoliosis noted of thoracic or lumbar spine.   Extremities:  No clubbing, cyanosis, edema, or deformity noted with normal full range of motion of all joints.   Neurologic:  alert & oriented X3 and gait normal.   Skin:  Intact without suspicious lesions or rashes Psych:  Cognition and judgment appear intact. Alert and cooperative with normal attention span and concentration. No apparent delusions, illusions, hallucinations        Assessment & Plan:   1. Palpitations   Deteriorated.  EKG -nsr. Etiology unclear at this point- ?possible thyroid dysfunction given other symptoms vs anxiety.    If labs negative, will refer to cards for holter monitor. The  patient indicates understanding of these issues and agrees with the plan.  Comprehensive metabolic panel, EKG 12-Lead, EKG 12-Lead  2. Fatigue   Deteriorated.  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 See above. She declined psychotherapy or SSRI. CBC with Differential, TSH, T4, Free, EKG 12-Lead, EKG 12-Lead

## 2012-01-28 MED ORDER — LEVOTHYROXINE SODIUM 25 MCG PO TABS: 25.0000 ug | ORAL_TABLET | Freq: Every day | ORAL | Status: DC

## 2012-01-28 NOTE — Progress Notes (Signed)
Addended by: Dianne Dun on: 01/28/2012 11:29 AM   Modules accepted: Orders

## 2012-02-10 ENCOUNTER — Telehealth: Payer: Self-pay

## 2012-02-10 ENCOUNTER — Other Ambulatory Visit (INDEPENDENT_AMBULATORY_CARE_PROVIDER_SITE_OTHER): Payer: 59

## 2012-02-10 DIAGNOSIS — R5383 Other fatigue: Secondary | ICD-10-CM

## 2012-02-10 DIAGNOSIS — R5381 Other malaise: Secondary | ICD-10-CM

## 2012-02-10 LAB — TSH: TSH: 0.5 u[IU]/mL (ref 0.35–5.50)

## 2012-02-10 NOTE — Telephone Encounter (Signed)
Advised patient, appt made for today.

## 2012-02-10 NOTE — Telephone Encounter (Signed)
Pt saw Dr Dayton Martes 01/27/12; started Levothyroxine 25 mcg on 01/28/12 and felt great, energetic and felt like herself. On 02/04/12 pt said she began feeling tired, depressed and having mood swings. Pt can be reached at [phone] and uses [address]

## 2012-02-10 NOTE — Telephone Encounter (Signed)
Left message for patient to call back  

## 2012-02-10 NOTE — Telephone Encounter (Signed)
Let's recheck her TSH and FT4.

## 2012-04-12 ENCOUNTER — Encounter: Payer: Self-pay | Admitting: Family Medicine

## 2012-04-12 ENCOUNTER — Ambulatory Visit (INDEPENDENT_AMBULATORY_CARE_PROVIDER_SITE_OTHER): Payer: 59 | Admitting: Family Medicine

## 2012-04-12 VITALS — BP 116/70 | HR 60 | Temp 98.4°F | Ht 66.0 in | Wt 186.0 lb

## 2012-04-12 DIAGNOSIS — R002 Palpitations: Secondary | ICD-10-CM

## 2012-04-12 DIAGNOSIS — R5383 Other fatigue: Secondary | ICD-10-CM

## 2012-04-12 DIAGNOSIS — R5381 Other malaise: Secondary | ICD-10-CM

## 2012-04-12 LAB — CBC WITH DIFFERENTIAL/PLATELET
Basophils Absolute: 0 10*3/uL (ref 0.0–0.1)
Eosinophils Absolute: 0.1 10*3/uL (ref 0.0–0.7)
Hemoglobin: 12.2 g/dL (ref 12.0–15.0)
Lymphocytes Relative: 38.2 % (ref 12.0–46.0)
MCHC: 33.2 g/dL (ref 30.0–36.0)
Neutro Abs: 2.8 10*3/uL (ref 1.4–7.7)
RDW: 14.3 % (ref 11.5–14.6)

## 2012-04-12 LAB — COMPREHENSIVE METABOLIC PANEL
BUN: 14 mg/dL (ref 6–23)
CO2: 24 mEq/L (ref 19–32)
Calcium: 8.7 mg/dL (ref 8.4–10.5)
Chloride: 108 mEq/L (ref 96–112)
Creatinine, Ser: 0.8 mg/dL (ref 0.4–1.2)
GFR: 87.33 mL/min (ref >60.00)

## 2012-04-12 LAB — T4, FREE: Free T4: 0.67 ng/dL (ref 0.60–1.60)

## 2012-04-12 MED ORDER — MELOXICAM 7.5 MG PO TABS: 7.5000 mg | ORAL_TABLET | Freq: Every day | ORAL | Status: DC

## 2012-04-12 NOTE — Progress Notes (Signed)
Subjective:    Patient ID: Danielle Schneider, female    DOB: [birth year], 31 y.o.   MRN: [id number]  HPI  31 yo here for 3 months follow up.  Palpitations- past three years, h/o intermittent palpitations. Per pt, EKGs always within normal limits and was normal when I first saw her in 4/32. Does endorse some DOE but she thinks she is just out of shape. She is having difficulty losing her pregnancy weight.  No CP. No SI or HI.  H/o depression as a teenager but does not feel depressed now.  Sleeping better but always very fatigued.  Pos FH of CAD- dad had first MI in his 61s.  TSH and FT4 were borderline in April so we attempted low dose synthroid.  Symptoms improved first week, then felt worse so we stopped it. In May, TSH was normal.  Lab Results  Component Value Date   TSH 0.50 02/10/2012    Patient Active Problem List  Diagnosis  . 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 Palpitations  . Fatigue   Past Medical History  Diagnosis Date  . History of nephrolithiasis 02/2011  . Abnormal Pap smear   . Anal sphincter incontinence after fourth degree tear    resolved as of 2012  . Panic attack     history of   Past Surgical History  Procedure Date  . Shoulder open rotator cuff repair 2004  . Rib cage 2006    rib cage bone removal  . Cesarean section   . Post partum sterilization   . Shoulder surgery   . Thoracic outlet surgery    History  Substance Use Topics  . Smoking status: Former Smoker    Quit date: 04/16/2008  . Smokeless tobacco: Never Used  . Alcohol Use: No   Family History  Problem Relation Age of Onset  . Heart disease Father     MI in 52s   Allergies  Allergen Reactions  . Vicodin (Hydrocodone-Acetaminophen) Other (See Comments)    Nausea, dizziness, cold sweats   Current Outpatient Prescriptions on File Prior to Visit  Medication Sig Dispense Refill  . ALBUTEROL IN Inhale 2 puffs into the lungs 4 (four) times a week.       . Ascorbic Acid  (VITAMIN C) 1000 MG tablet Take 1,000 mg by mouth daily.        . B Complex-C (B-COMPLEX WITH VITAMIN C) tablet Take 1 tablet by mouth daily.        Marland Kitchen levothyroxine (SYNTHROID, LEVOTHROID) 25 MCG tablet Take 1 tablet (25 mcg total) by mouth daily.  30 tablet  3  . meloxicam (MOBIC) 7.5 MG tablet Take 1 tablet (7.5 mg total) by mouth daily.  30 tablet  0  . Multiple Vitamin (MULTIVITAMIN) tablet Take 1 tablet by mouth daily.      Marland Kitchen pyridOXINE (VITAMIN B-6) 100 MG tablet Take 100 mg by mouth daily.        Marland Kitchen senna-docusate (SENOKOT-S) 8.6-50 MG per tablet Take 1 tablet by mouth at bedtime.  30 tablet  0   The PMH, PSH, Social History, Family History, Medications, and allergies have been reviewed in [facility], and have been updated if relevant.   Review of Systems See HPI No DOE No HA or blurred vision No cold or heat intolerance    Objective:   Physical Exam BP 116/70  Pulse 60  Temp 98.4 F (36.9 C)  Wt 186 lb (84.369 kg)  Breastfeeding? No  General:  Well-developed,well-nourished,in  no acute distress; alert,appropriate and cooperative throughout examination Head:  normocephalic and atraumatic.   Eyes:  vision grossly intact, pupils equal, pupils round, and pupils reactive to light.   Ears:  R ear normal and L ear normal.   Nose:  no external deformity.   Mouth:  good dentition.   Neck:  No deformities, masses, or tenderness noted. Lungs:  Normal respiratory effort, chest expands symmetrically. Lungs are clear to auscultation, no crackles or wheezes. Heart:  Normal rate and regular rhythm. S1 and S2 normal without gallop, murmur, click, rub or other extra sounds. Msk:  No deformity or scoliosis noted of thoracic or lumbar spine.   Extremities:  No clubbing, cyanosis, edema, or deformity noted with normal full range of motion of all joints.   Neurologic:  alert & oriented X3 and gait normal.   Skin:  Intact without suspicious lesions or rashes Psych:  Cognition and judgment appear  intact. Alert and cooperative with normal attention span and concentration. No apparent delusions, illusions, hallucinations        Assessment & Plan:   1. Palpitations   Recheck labs today. Order 48 hour holter monitor. The patient indicates understanding of these issues and agrees with the plan.   Comprehensive metabolic panel, EKG 12-Lead, EKG 12-Lead  2. Fatigue   Deteriorated.  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 See above. Marland Kitchen CBC with Differential, TSH, T4, Free, EKG 12-Lead, EKG 12-Lead

## 2012-04-12 NOTE — Patient Instructions (Addendum)
Good to see you. Please stop by to see Asher Muir on your way out to set up your Holter Monitor.

## 2012-04-13 LAB — VITAMIN D 25 HYDROXY (VIT D DEFICIENCY, FRACTURES): Vit D, 25-Hydroxy: 47 ng/mL (ref 30–89)

## 2012-04-17 ENCOUNTER — Encounter: Payer: Self-pay | Admitting: *Deleted

## 2012-04-24 ENCOUNTER — Encounter (INDEPENDENT_AMBULATORY_CARE_PROVIDER_SITE_OTHER): Payer: 59

## 2012-04-24 DIAGNOSIS — R002 Palpitations: Secondary | ICD-10-CM

## 2012-05-03 ENCOUNTER — Telehealth: Payer: Self-pay

## 2012-05-03 NOTE — Telephone Encounter (Signed)
Pt called for heart monitor results done on 04/24/12; pt said was told at time of test that the doctor giving the results of the heart monitor was out of office until Mon. On 04/29/12 and 04/30/12 felt like tightness in chest where could not get breath. The inhaler did not help. No tightness or trouble breathing yesterday or today. Pt has non productive cough usually worse when trying to sleep. [address] I spoke with Johnny Bridge at [facility] in [place]; usually referring Dr will get heart monitor results 48 - 72 hours. Left v/m for Windell Moulding in monitoring to call office back re: results. Please advise.

## 2012-05-03 NOTE — Telephone Encounter (Signed)
Left message asking pt to call back. 

## 2012-05-03 NOTE — Telephone Encounter (Signed)
Advised patient as instructed. 

## 2012-05-03 NOTE — Telephone Encounter (Signed)
Unfortunately, as you noted, I have not yet received these results.  I will let her know as soon as I have them.  If she has another episode like she is describing, please go straight to ER.

## 2012-05-09 ENCOUNTER — Telehealth: Payer: Self-pay

## 2012-05-09 DIAGNOSIS — R079 Chest pain, unspecified: Secondary | ICD-10-CM

## 2012-05-09 NOTE — Telephone Encounter (Signed)
Pt request heart monitor report. Holter monitor results under Media or ECG tab.Please advise.

## 2012-05-09 NOTE — Telephone Encounter (Signed)
Advised patient of results.  She is asking if she needs to do anything else.  She is still having spells of tightness in her chest, shortness of breath- these spells are getting worse.    This morning she had tightness in her chest, tingling in right jaw and neck, neck was hurting.  This lasted for about 20 minutes.  These spells make her feel very tired afterwards.  I advised her that you will probably refer to cardio, she has no preference where.

## 2012-05-09 NOTE — Telephone Encounter (Signed)
Left message asking pt to call back. 

## 2012-05-09 NOTE — Telephone Encounter (Signed)
I am sorry, I thought she did receive those results.  It was normal with occassional PACs (extra beats).

## 2012-05-09 NOTE — Telephone Encounter (Signed)
Yes I do want to refer to cardiology immediately.  I have placed referral.

## 2012-05-09 NOTE — Telephone Encounter (Signed)
Advised patient, she will wait to hear from [facility].

## 2012-05-11 ENCOUNTER — Encounter: Payer: Self-pay | Admitting: Cardiovascular Disease

## 2012-05-11 ENCOUNTER — Ambulatory Visit (INDEPENDENT_AMBULATORY_CARE_PROVIDER_SITE_OTHER): Payer: 59 | Admitting: Cardiovascular Disease

## 2012-05-11 VITALS — BP 112/75 | HR 81 | Ht 66.0 in | Wt 185.0 lb

## 2012-05-11 DIAGNOSIS — R0602 Shortness of breath: Secondary | ICD-10-CM

## 2012-05-11 DIAGNOSIS — R002 Palpitations: Secondary | ICD-10-CM

## 2012-05-11 DIAGNOSIS — R079 Chest pain, unspecified: Secondary | ICD-10-CM

## 2012-05-11 NOTE — Procedures (Signed)
    Treadmill Stress test  Indication: Chest pain and dyspnea.  Baseline Data:  Resting EKG shows NSR with rate of 70 bpm, no significant ST or T wave changes. Resting blood pressure of 112/75 mm Hg Stand bruce protocal was used.  Exercise Data:  Patient exercised for 9 min 17 sec,  Peak heart rate of 149 bpm.  This was 79 % of the maximum predicted heart rate. No symptoms of chest pain or lightheadedness were reported at peak stress or in recovery.  Peak Blood pressure recorded was 154/78 Maximal work level: 10.1 METs.  Heart rate at 3 minutes in recovery was 75 bpm. BP response: Normal HR response: Normal  EKG with Exercise: Sinus tachycardia with no significant ST changes.  FINAL IMPRESSION: Normal exercise stress test at slightly submaximal workload. No significant EKG changes concerning for ischemia. Reasonable exercise tolerance but below average for age.  Recommendation: Proceed with echocardiogram.

## 2012-05-11 NOTE — Patient Instructions (Addendum)
Your physician has requested that you have an exercise tolerance test. For further information please visit [email or web address]. Please also follow instruction sheet, as given.  Your physician has requested that you have an echocardiogram. Echocardiography is a painless test that uses sound waves to create images of your heart. It provides your doctor with information about the size and shape of your heart and how well your heart's chambers and valves are working. This procedure takes approximately one hour. There are no restrictions for this procedure.  Follow up as needed  

## 2012-05-11 NOTE — Assessment & Plan Note (Signed)
The patient reports progressive exertional chest tightness or any clear etiology. He has no significant risk factors for coronary artery disease other than family history. Baseline ECG is normal. I proceeded with a treadmill stress test today which showed no evidence of ischemia at submaximal workload. He was able to get her heart rate only to 79% of maximum predicted heart rate with significant fatigue. Her functional capacity is below average for her age. There is likely a component of physical deconditioning. Would proceed with an echocardiogram to make sure she has no structural heart abnormalities.

## 2012-05-11 NOTE — Assessment & Plan Note (Signed)
I will obtain an echocardiogram to evaluate LV systolic function and pulmonary pressure. If her cardiac workup is unremarkable, she likely would benefit from starting an exercise program. I also wonder if mild depression/anxiety might be contributing to some of her symptoms.

## 2012-05-11 NOTE — Assessment & Plan Note (Signed)
No evidence of significant arrhythmia. There were monitor was overall unremarkable except for some PACs.

## 2012-05-11 NOTE — Progress Notes (Signed)
HPI  This is a 32 year old female was referred by Dr. Dayton Martes for evaluation of palpitations and chest pain. The patient has no previous cardiac history or chronic medical conditions. She reports symptoms of palpitations over the last few years without documented arrhythmia. She had a recent 48-hour Holter monitor which showed a few PACs but no other significant arrhythmia. She started having worsening exertional chest tightness and dyspnea recently. She seems to be very frustrated about her symptoms. She is married and has 3 children. The youngest is 28-year-old. 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 She reports getting out of breath very easily with associated chest tightness. She has been using albuterol as needed but she has no documented history of asthma. She did smoke in the past but quit in 2009. She had some problems with sleeping in the past but that's now improving now that her youngest son is sleeping longer. She does have family history of premature coronary artery disease. She has previous history of depression as a teenager. She had routine labs performed which were unremarkable including normal thyroid function.   Allergies  Allergen Reactions  . Vicodin (Hydrocodone-Acetaminophen) Other (See Comments)    Nausea, dizziness, cold sweats     Current Outpatient Prescriptions on File Prior to Visit  Medication Sig Dispense Refill  . ALBUTEROL IN Inhale 2 puffs into the lungs 4 (four) times a week.       . Ascorbic Acid (VITAMIN C) 1000 MG tablet Take 1,000 mg by mouth daily.        . B Complex-C (B-COMPLEX WITH VITAMIN C) tablet Take 1 tablet by mouth daily.        . meloxicam (MOBIC) 7.5 MG tablet Take 1 tablet (7.5 mg total) by mouth daily.  30 tablet  0  . Multiple Vitamin (MULTIVITAMIN) tablet Take 1 tablet by mouth daily.      Marland Kitchen pyridOXINE (VITAMIN B-6) 100 MG tablet Take 100 mg by mouth daily.           Past Medical History    Diagnosis Date  . History of nephrolithiasis 02/2011  . Abnormal Pap smear   . Anal sphincter incontinence after fourth degree tear    resolved as of 2012  . Panic attack     history of     Past Surgical History  Procedure Date  . Shoulder open rotator cuff repair 2004  . Rib cage 2006    rib cage bone removal  . Cesarean section   . Post partum sterilization   . Shoulder surgery   . Thoracic outlet surgery      Family History  Problem Relation Age of Onset  . Heart disease Father     MI in 70s     History   Social History  . Marital Status: Married    Spouse Name: N/A    Number of Children: N/A  . Years of Education: N/A   Occupational History  . Not on file.   Social History Main Topics  . Smoking status: Former Smoker    Quit date: 04/16/2008  . Smokeless tobacco: Never Used  . Alcohol Use: No  . Drug Use: No  . Sexually Active: Not Currently    Birth Control/ Protection: Surgical   Other Topics Concern  . Not on file   Social History Narrative  . No narrative on file     ROS Constitutional: Negative for fever, chills,  diaphoresis, activity change, appetite change .  HENT: Negative for hearing loss, nosebleeds, congestion, sore throat, facial swelling, drooling, trouble swallowing, neck pain, voice change, sinus pressure and tinnitus.  Eyes: Negative for photophobia, pain, discharge and visual disturbance.  Respiratory: Negative for apnea, cough and wheezing.  Cardiovascular: Negative for  leg swelling.  Gastrointestinal: Negative for nausea, vomiting, abdominal pain, diarrhea, constipation, blood in stool and abdominal distention.  Genitourinary: Negative for dysuria, urgency, frequency, hematuria and decreased urine volume.  Musculoskeletal: Negative for myalgias, back pain, joint swelling, arthralgias and gait problem.  Skin: Negative for color change, pallor, rash and wound.  Neurological: Negative for dizziness, tremors, seizures, syncope,  speech difficulty, weakness, light-headedness, numbness and headaches.  Psychiatric/Behavioral: Negative for suicidal ideas, hallucinations, behavioral problems and agitation. The patient is anxious.     PHYSICAL EXAM   BP 112/75  Pulse 81  Ht 5\' 6"  (1.676 m)  Wt 185 lb (83.915 kg)  BMI 29.86 kg/m2  SpO2 99% Constitutional: She is oriented to person, place, and time. She appears well-developed and well-nourished. No distress.  HENT: No nasal discharge.  Head: Normocephalic and atraumatic.  Eyes: Pupils are equal and round. Right eye exhibits no discharge. Left eye exhibits no discharge.  Neck: Normal range of motion. Neck supple. No JVD present. No thyromegaly present.  Cardiovascular: Normal rate, regular rhythm, normal heart sounds. Exam reveals no gallop and no friction rub. No murmur heard.  Pulmonary/Chest: Effort normal and breath sounds normal. No stridor. No respiratory distress. She has no wheezes. She has no rales. She exhibits no tenderness.  Abdominal: Soft. Bowel sounds are normal. She exhibits no distension. There is no tenderness. There is no rebound and no guarding.  Musculoskeletal: Normal range of motion. She exhibits no edema and no tenderness.  Neurological: She is alert and oriented to person, place, and time. Coordination normal.  Skin: Skin is warm and dry. No rash noted. She is not diaphoretic. No erythema. No pallor.  Psychiatric: She has a normal mood and affect. Her behavior is normal. Judgment and thought content normal.     EKG: Sinus  Rhythm  WITHIN NORMAL LIMITS   ASSESSMENT AND PLAN

## 2012-05-11 NOTE — Patient Instructions (Addendum)
Your stress test was normal.

## 2012-05-12 ENCOUNTER — Other Ambulatory Visit: Payer: Self-pay | Admitting: *Deleted

## 2012-05-12 ENCOUNTER — Other Ambulatory Visit: Payer: Self-pay | Admitting: Obstetrics and Gynecology

## 2012-05-12 MED ORDER — ALBUTEROL SULFATE HFA 108 (90 BASE) MCG/ACT IN AERS: 2.0000 | INHALATION_SPRAY | Freq: Four times a day (QID) | RESPIRATORY_TRACT | Status: DC | PRN

## 2012-05-29 ENCOUNTER — Encounter: Payer: Self-pay | Admitting: Family Medicine

## 2012-05-29 ENCOUNTER — Ambulatory Visit (INDEPENDENT_AMBULATORY_CARE_PROVIDER_SITE_OTHER): Payer: 59 | Admitting: Family Medicine

## 2012-05-29 VITALS — BP 120/82 | HR 76 | Temp 97.7°F | Wt 188.0 lb

## 2012-05-29 DIAGNOSIS — J029 Acute pharyngitis, unspecified: Secondary | ICD-10-CM

## 2012-05-29 MED ORDER — PENICILLIN V POTASSIUM 500 MG PO TABS: 500.0000 mg | ORAL_TABLET | Freq: Three times a day (TID) | ORAL | Status: DC

## 2012-05-29 NOTE — Patient Instructions (Addendum)

## 2012-05-29 NOTE — Progress Notes (Signed)
SUBJECTIVE: 31 y.o. female with sore throat, myalgias, swollen glands, headache and fever for 1 day. No history of rheumatic fever. Other symptoms: pain while swallowing.  Patient Active Problem List  Diagnosis  . 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 Palpitations  . Fatigue  . Chest pain  . Shortness of breath  . Pharyngitis   Past Medical History  Diagnosis Date  . History of nephrolithiasis 02/2011  . Abnormal Pap smear   . Anal sphincter incontinence after fourth degree tear    resolved as of 2012  . Panic attack     history of   Past Surgical History  Procedure Date  . Shoulder open rotator cuff repair 2004  . Rib cage 2006    rib cage bone removal  . Cesarean section   . Post partum sterilization   . Shoulder surgery   . Thoracic outlet surgery    History  Substance Use Topics  . Smoking status: Former Smoker    Quit date: 04/16/2008  . Smokeless tobacco: Never Used  . Alcohol Use: No   Family History  Problem Relation Age of Onset  . Heart disease Father     MI in 29s   Allergies  Allergen Reactions  . Vicodin (Hydrocodone-Acetaminophen) Other (See Comments)    Nausea, dizziness, cold sweats   Current Outpatient Prescriptions on File Prior to Visit  Medication Sig Dispense Refill  . albuterol (PROVENTIL HFA;VENTOLIN HFA) 108 (90 BASE) MCG/ACT inhaler Inhale 2 puffs into the lungs every 6 (six) hours as needed for wheezing.  1 Inhaler  4  . ALBUTEROL IN Inhale 2 puffs into the lungs 4 (four) times a week.       . Ascorbic Acid (VITAMIN C) 1000 MG tablet Take 1,000 mg by mouth daily.        . B Complex-C (B-COMPLEX WITH VITAMIN C) tablet Take 1 tablet by mouth daily.        . meloxicam (MOBIC) 7.5 MG tablet Take 1 tablet (7.5 mg total) by mouth daily.  30 tablet  0  . Multiple Vitamin (MULTIVITAMIN) tablet Take 1 tablet by mouth daily.      Marland Kitchen pyridOXINE (VITAMIN B-6) 100 MG tablet Take 100 mg by mouth daily.         The PMH, PSH, Social History, Family  History, Medications, and allergies have been reviewed in [facility], and have been updated if relevant.  OBJECTIVE:  BP 120/82  Pulse 76  Temp 97.7 F (36.5 C)  Wt 188 lb (85.276 kg)  Appears alert, well appearing, and in no distress. Ears: bilateral TM's and external ear canals normal Oropharynx: tonsils hypertrophied with exudate Neck: supple, no significant adenopathy Lungs: clear to auscultation, no wheezes, rales or rhonchi, symmetric air entry Rapid Strep test is positive   ASSESSMENT: Streptococcal pharyngitis  PLAN: Per orders. PCN 500 mg three times daily x 10 days.  Gargle, use acetaminophen or other OTC analgesic, and take Rx fully as prescribed. Call if other family members develop similar symptoms. See prn.

## 2012-05-30 ENCOUNTER — Other Ambulatory Visit (INDEPENDENT_AMBULATORY_CARE_PROVIDER_SITE_OTHER): Payer: 59

## 2012-05-30 ENCOUNTER — Other Ambulatory Visit: Payer: Self-pay

## 2012-05-30 DIAGNOSIS — R0602 Shortness of breath: Secondary | ICD-10-CM

## 2012-05-30 DIAGNOSIS — R079 Chest pain, unspecified: Secondary | ICD-10-CM

## 2012-06-02 ENCOUNTER — Telehealth: Payer: Self-pay

## 2012-06-02 NOTE — Telephone Encounter (Signed)
Pt starting school and not sure if had MMR and pt thinks she had chicken pox as a child. Pt will ck with pts mother to see if pt had chicken pox; pt will see if high school has immunization report and pt will call back if need titer or other immunizations.

## 2012-06-09 ENCOUNTER — Ambulatory Visit (INDEPENDENT_AMBULATORY_CARE_PROVIDER_SITE_OTHER)
Admission: RE | Admit: 2012-06-09 | Discharge: 2012-06-09 | Disposition: A | Discharge status: Home / Self Care | Payer: 59 | Source: Ambulatory Visit | Attending: Family Medicine | Admitting: Family Medicine

## 2012-06-09 ENCOUNTER — Encounter: Payer: Self-pay | Admitting: Family Medicine

## 2012-06-09 ENCOUNTER — Ambulatory Visit (INDEPENDENT_AMBULATORY_CARE_PROVIDER_SITE_OTHER): Payer: 59 | Admitting: Family Medicine

## 2012-06-09 VITALS — BP 110/72 | HR 78 | Temp 98.3°F | Wt 184.8 lb

## 2012-06-09 DIAGNOSIS — M545 Low back pain: Secondary | ICD-10-CM

## 2012-06-09 MED ORDER — TRAMADOL HCL 50 MG PO TABS: 50.0000 mg | ORAL_TABLET | Freq: Three times a day (TID) | ORAL | Status: AC | PRN

## 2012-06-09 MED ORDER — CYCLOBENZAPRINE HCL 10 MG PO TABS: 10.0000 mg | ORAL_TABLET | Freq: Two times a day (BID) | ORAL | Status: DC | PRN

## 2012-06-09 NOTE — Progress Notes (Signed)
  Subjective:    Patient ID: Danielle Schneider, female    DOB: 05-31-1981, 31 y.o.   MRN: [id number]  HPI CC: tailbone pain  DOI: 8/19 L[place] Seat down 7 stairs at home.  Landed on tailbone.    Taking meloxicam 7.5 mg, hasn't helped.  Pain plateaued.  Did have bruise - improving.  Then 2 nights ago - when sitting up from couch, felt something shfting, worse pain since then.  Not using cushion.  Not using ice.  Having shooting pain down posterior legs to kneecaps.    Denies saddle anesthesia.  Denies weakness of legs. Sitting and transfer to standing worsens pain.  Improved pain when leaning forward in seated position or when bears weight on hips.  Review of Systems Per HPI    Objective:   Physical Exam  Nursing note and vitals reviewed. Constitutional: She appears well-developed and well-nourished. No distress.  Musculoskeletal:       Lumbar back: She exhibits decreased range of motion (limited flexion by pain) and tenderness.       Midline lower lumbar discomfort to palpation. + paraspinous mm tenderness and spasm as well. No significant pain at sacral region. Neg SLR bilaterally. Tender to palpation bilateral SIJ and sciatic notch.  Neurological: She is alert. She has normal strength. No sensory deficit.       Stiff movements  Skin: Skin is warm and dry. No rash noted.       Resolving bruise R SI area  Psychiatric: She has a normal mood and affect.       Assessment & Plan:

## 2012-06-09 NOTE — Patient Instructions (Addendum)
You do have tailbone injury - start icing area as well as may use tramadol for breakthrough pain, flexeril as muscle relaxant at night for significant tightness (don't use together as both cause sedation). Also use cushion for whenever sitting on hard surface. If not improving with above measures, let us know.

## 2012-06-09 NOTE — Assessment & Plan Note (Addendum)
Denies possibility of pregnancy today. No red flags today. Anticipate tailbone injury - xray today - no acute fracture,?osteitis pubis - will await radiology read. Treat with stronger pain med as meloxicam not helping - provided with tramadol (vicodin allergy) for short course as well as flexeril muscle relaxant. Recommended ice and cushion use. If not improving, update for further evaluation.

## 2012-06-15 ENCOUNTER — Ambulatory Visit (INDEPENDENT_AMBULATORY_CARE_PROVIDER_SITE_OTHER): Payer: 59 | Admitting: Family Medicine

## 2012-06-15 ENCOUNTER — Encounter: Payer: Self-pay | Admitting: Family Medicine

## 2012-06-15 VITALS — BP 104/70 | HR 76 | Temp 97.8°F | Wt 184.0 lb

## 2012-06-15 DIAGNOSIS — R002 Palpitations: Secondary | ICD-10-CM

## 2012-06-15 DIAGNOSIS — M8530 Osteitis condensans, unspecified site: Secondary | ICD-10-CM

## 2012-06-15 DIAGNOSIS — M869 Osteomyelitis, unspecified: Secondary | ICD-10-CM

## 2012-06-15 DIAGNOSIS — M898X8 Other specified disorders of bone, other site: Secondary | ICD-10-CM | POA: Insufficient documentation

## 2012-06-15 DIAGNOSIS — R079 Chest pain, unspecified: Secondary | ICD-10-CM

## 2012-06-15 NOTE — Patient Instructions (Addendum)
Let's go ahead and start an exercise program as discussed. I will also refer you to physical therapy ( I have placed this order) and they should call you.

## 2012-06-15 NOTE — Progress Notes (Signed)
Subjective:    Patient ID: Danielle Schneider, female    DOB: 1980-11-05, 31 y.o.   MRN: [id number]  HPI  31 yo here for follow up CP and palpitations.  Palpitations- past three years, h/o intermittent palpitations. Also having progressively worsening exertional chest tightness.  Referred to cardiology- saw Dr. Kirke Corin- notes reviewed.  Stress test, 2 D echo and holter monitor unremarkable.  He felt deconditioning and/or anxiety may be playing a roll.  Pt does not feel more anxious- she is going to school , has two small children and working so she has multiple stressors.  Fell on tailbone last week- saw Dr. Bonne Dolores showed some findings consistent with osteitis pubis.  She has been having right hip pain for months and wonders if this could be related. Patient Active Problem List  Diagnosis  . 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 Palpitations  . Fatigue  . Chest pain  . Shortness of breath  . Pharyngitis  . Lower back pain   Past Medical History  Diagnosis Date  . History of nephrolithiasis 02/2011  . Abnormal Pap smear   . Anal sphincter incontinence after fourth degree tear    resolved as of 2012  . Panic attack     history of   Past Surgical History  Procedure Date  . Shoulder open rotator cuff repair 2004  . Rib cage 2006    rib cage bone removal  . Cesarean section   . Post partum sterilization   . Shoulder surgery   . Thoracic outlet surgery    History  Substance Use Topics  . Smoking status: Former Smoker    Quit date: 04/16/2008  . Smokeless tobacco: Never Used  . Alcohol Use: No   Family History  Problem Relation Age of Onset  . Heart disease Father     MI in 93s   Allergies  Allergen Reactions  . Vicodin (Hydrocodone-Acetaminophen) Other (See Comments)    Nausea, dizziness, cold sweats   Current Outpatient Prescriptions on File Prior to Visit  Medication Sig Dispense Refill  . albuterol (PROVENTIL HFA;VENTOLIN HFA) 108 (90 BASE) MCG/ACT  inhaler Inhale 2 puffs into the lungs every 6 (six) hours as needed for wheezing.  1 Inhaler  4  . ALBUTEROL IN Inhale 2 puffs into the lungs 4 (four) times a week.       . Ascorbic Acid (VITAMIN C) 1000 MG tablet Take 1,000 mg by mouth daily.        . B Complex-C (B-COMPLEX WITH VITAMIN C) tablet Take 1 tablet by mouth daily.        . cyclobenzaprine (FLEXERIL) 10 MG tablet Take 1 tablet (10 mg total) by mouth 2 (two) times daily as needed for muscle spasms (sedation precautions).  30 tablet  0  . meloxicam (MOBIC) 7.5 MG tablet Take 7.5 mg by mouth as needed.      . Multiple Vitamin (MULTIVITAMIN) tablet Take 1 tablet by mouth daily.      Marland Kitchen pyridOXINE (VITAMIN B-6) 100 MG tablet Take 100 mg by mouth daily.        . traMADol (ULTRAM) 50 MG tablet Take 1 tablet (50 mg total) by mouth every 8 (eight) hours as needed for pain.  30 tablet  0   The PMH, PSH, Social History, Family History, Medications, and allergies have been reviewed in [facility], and have been updated if relevant.   Review of Systems See HPI No DOE No HA or blurred vision No cold  or heat intolerance    Objective:   Physical Exam Wt 184 lb (83.462 kg)  LMP 05/07/2012  General:  Well-developed,well-nourished,in no acute distress; alert,appropriate and cooperative throughout examination Head:  normocephalic and atraumatic.   Eyes:  vision grossly intact, pupils equal, pupils round, and pupils reactive to light.   Ears:  R ear normal and L ear normal.   Nose:  no external deformity.   Mouth:  good dentition.   Neck:  No deformities, masses, or tenderness noted. Lungs:  Normal respiratory effort, chest expands symmetrically. Lungs are clear to auscultation, no crackles or wheezes. Heart:  Normal rate and regular rhythm. S1 and S2 normal without gallop, murmur, click, rub or other extra sounds. Msk:  No deformity or scoliosis noted of thoracic or lumbar spine.   Extremities:  No clubbing, cyanosis, edema, or deformity noted with  normal full range of motion of all joints.   Neurologic:  alert & oriented X3 and gait normal.   Skin:  Intact without suspicious lesions or rashes Psych:  Cognition and judgment appear intact. Alert and cooperative with normal attention span and concentration. No apparent delusions, illusions, hallucinations        Assessment & Plan:   1. Chest pain  Persistent and neg extensive cardiac work up. Agree this is likely deconditioning with intermittent anxiety.  She does not want to start anxiolytics.  She would be willing to start exercise- discussed different exercise routines. The patient indicates understanding of these issues and agrees with the plan.    2. Palpitations  See above.   3. Osteitis pubis  Continue NSAIDs. Given duration of symptoms- ?possibly related to her long term hip/thigh pain, will refer to PT. Ambulatory referral to Physical Therapy

## 2012-06-28 ENCOUNTER — Ambulatory Visit: Payer: 59 | Admitting: Physical Therapy

## 2012-09-04 ENCOUNTER — Other Ambulatory Visit: Payer: Self-pay | Admitting: Family Medicine

## 2012-09-04 ENCOUNTER — Telehealth: Payer: Self-pay | Admitting: Family Medicine

## 2012-09-04 DIAGNOSIS — M869 Osteomyelitis, unspecified: Secondary | ICD-10-CM

## 2012-09-04 NOTE — Telephone Encounter (Signed)
Patient could not go to Outpatient rehab in Sept and the Referral has expired and they need a New PT REferral put in Epic. Osteoitis Pubis. Patient is requesting to go to [facility] at [facility] location. Call her at [phone] she is a Producer, television/film/video.

## 2012-09-14 ENCOUNTER — Ambulatory Visit: Payer: 59 | Attending: Family Medicine | Admitting: Physical Therapy

## 2012-09-14 DIAGNOSIS — IMO0001 Reserved for inherently not codable concepts without codable children: Secondary | ICD-10-CM | POA: Insufficient documentation

## 2012-09-14 DIAGNOSIS — M25559 Pain in unspecified hip: Secondary | ICD-10-CM | POA: Insufficient documentation

## 2012-10-16 ENCOUNTER — Ambulatory Visit (INDEPENDENT_AMBULATORY_CARE_PROVIDER_SITE_OTHER)
Admission: RE | Admit: 2012-10-16 | Discharge: 2012-10-16 | Disposition: A | Discharge status: Home / Self Care | Payer: 59 | Source: Ambulatory Visit | Attending: Family Medicine | Admitting: Family Medicine

## 2012-10-16 ENCOUNTER — Ambulatory Visit (INDEPENDENT_AMBULATORY_CARE_PROVIDER_SITE_OTHER): Payer: 59 | Admitting: Family Medicine

## 2012-10-16 ENCOUNTER — Encounter: Payer: Self-pay | Admitting: Family Medicine

## 2012-10-16 VITALS — BP 110/74 | HR 70 | Temp 97.9°F | Ht 66.0 in | Wt 184.5 lb

## 2012-10-16 DIAGNOSIS — M954 Acquired deformity of chest and rib: Secondary | ICD-10-CM

## 2012-10-16 DIAGNOSIS — F411 Generalized anxiety disorder: Secondary | ICD-10-CM

## 2012-10-16 DIAGNOSIS — F419 Anxiety disorder, unspecified: Secondary | ICD-10-CM

## 2012-10-16 MED ORDER — CITALOPRAM HYDROBROMIDE 10 MG PO TABS: 10.0000 mg | ORAL_TABLET | Freq: Every day | ORAL | Status: DC

## 2012-10-16 NOTE — Patient Instructions (Addendum)
Good to see you.  Happy New Year.  Please call me in approximately 3 weeks with an update.  I will call you with your xray results.

## 2012-10-16 NOTE — Progress Notes (Signed)
Subjective:    Patient ID: Danielle Schneider, female    DOB: [birth year], 32 y.o.   MRN: [id number]  HPI  Very pleasant 32 yo female here for:  1.  Lump on her sternum- notices that she has a prominence on her sternum.  Non tender but had not noticed it prior to a few weeks ago. Husband said he thinks it is more prominent as well.  No CP or SOB.  No pain with deep inspirations or palpation.  No known trauma.  2.  Anxiety- has been under more stress lately.  Feels like she "snaps" or reacts quicker to things than she used to.  Sometimes tearful but does not feel depressed. Not sleeping very well.  Appetite good.  No SI or HI.  Patient Active Problem List  Diagnosis  . Anxiety  . Palpitations  . Fatigue  . Chest pain  . Shortness of breath  . Pharyngitis  . Lower back pain  . Osteitis pubis   Past Medical History  Diagnosis Date  . History of nephrolithiasis 02/2011  . Abnormal Pap smear   . Anal sphincter incontinence after fourth degree tear    resolved as of 2012  . Panic attack     history of   Past Surgical History  Procedure Date  . Shoulder open rotator cuff repair 2004  . Rib cage 2006    rib cage bone removal  . Cesarean section   . Post partum sterilization   . Shoulder surgery   . Thoracic outlet surgery    History  Substance Use Topics  . Smoking status: Former Smoker    Quit date: 04/16/2008  . Smokeless tobacco: Never Used  . Alcohol Use: No   Family History  Problem Relation Age of Onset  . Heart disease Father     MI in 24s   Allergies  Allergen Reactions  . Vicodin (Hydrocodone-Acetaminophen) Other (See Comments)    Nausea, dizziness, cold sweats   Current Outpatient Prescriptions on File Prior to Visit  Medication Sig Dispense Refill  . albuterol (PROVENTIL HFA;VENTOLIN HFA) 108 (90 BASE) MCG/ACT inhaler Inhale 2 puffs into the lungs every 6 (six) hours as needed for wheezing.  1 Inhaler  4  . Ascorbic Acid (VITAMIN C) 1000 MG tablet Take  1,000 mg by mouth daily.        . B Complex-C (B-COMPLEX WITH VITAMIN C) tablet Take 1 tablet by mouth daily.        . meloxicam (MOBIC) 7.5 MG tablet Take 7.5 mg by mouth as needed.      . Multiple Vitamin (MULTIVITAMIN) tablet Take 1 tablet by mouth daily.      Marland Kitchen pyridOXINE (VITAMIN B-6) 100 MG tablet Take 100 mg by mouth daily.        . citalopram (CELEXA) 10 MG tablet Take 1 tablet (10 mg total) by mouth daily.  30 tablet  1   The PMH, PSH, Social History, Family History, Medications, and allergies have been reviewed in [facility], and have been updated if relevant.    Review of Systems    See HPI Objective:   Physical Exam BP 110/74  Pulse 70  Temp 97.9 F (36.6 C) (Oral)  Ht 5\' 6"  (1.676 m)  Wt 184 lb 8 oz (83.689 kg)  BMI 29.78 kg/m2  SpO2 98%  General:  Well-developed,well-nourished,in no acute distress; alert,appropriate and cooperative throughout examination Head:  normocephalic and atraumatic.   Eyes:  vision grossly intact, pupils equal, pupils round,  and pupils reactive to light.   Ears:  R ear normal and L ear normal.   Nose:  no external deformity.   Mouth:  good dentition.   Neck:  No deformities, masses, or tenderness noted. Chest: Prominent area on sternum, appears to be normal anatomy- no TTP Lungs:  Normal respiratory effort, chest expands symmetrically. Lungs are clear to auscultation, no crackles or wheezes. Heart:  Normal rate and regular rhythm. S1 and S2 normal without gallop, murmur, click, rub or other extra sounds. Msk:  No deformity or scoliosis noted of thoracic or lumbar spine.   Extremities:  No clubbing, cyanosis, edema, or deformity noted with normal full range of motion of all joints.   Neurologic:  alert & oriented X3 and gait normal.   Skin:  Intact without suspicious lesions or rashes Psych:  Cognition and judgment appear intact. Alert and cooperative with normal attention span and concentration. No apparent delusions, illusions,  hallucinations        Assessment & Plan:   1. Chest wall deformity  New- I suspect this is her normal anatomy but will get xray for further evaluation.  Very reassuring that it is non tender. DG Chest 2 View  2. Anxiety  Deteriorated.  Discussed treatment options.  Will start low dose celexa (10 mg daily).  Call me in 3 weeks with an update. The patient indicates understanding of these issues and agrees with the plan.

## 2012-11-15 ENCOUNTER — Encounter: Payer: Self-pay | Admitting: Family Medicine

## 2012-11-15 ENCOUNTER — Ambulatory Visit (INDEPENDENT_AMBULATORY_CARE_PROVIDER_SITE_OTHER): Payer: 59 | Admitting: Family Medicine

## 2012-11-15 ENCOUNTER — Telehealth: Payer: Self-pay

## 2012-11-15 VITALS — BP 110/72 | HR 64 | Temp 98.0°F | Wt 184.0 lb

## 2012-11-15 DIAGNOSIS — R51 Headache: Secondary | ICD-10-CM

## 2012-11-15 DIAGNOSIS — F419 Anxiety disorder, unspecified: Secondary | ICD-10-CM

## 2012-11-15 DIAGNOSIS — F411 Generalized anxiety disorder: Secondary | ICD-10-CM

## 2012-11-15 DIAGNOSIS — G43909 Migraine, unspecified, not intractable, without status migrainosus: Secondary | ICD-10-CM | POA: Insufficient documentation

## 2012-11-15 MED ORDER — CITALOPRAM HYDROBROMIDE 10 MG PO TABS: 10.0000 mg | ORAL_TABLET | Freq: Every day | ORAL | Status: DC

## 2012-11-15 MED ORDER — KETOROLAC TROMETHAMINE 60 MG/2ML IM SOLN
60.0000 mg | Freq: Once | INTRAMUSCULAR | Status: AC
  Administered 2012-11-15: 60 mg via INTRAMUSCULAR

## 2012-11-15 MED ORDER — SUMATRIPTAN SUCCINATE 25 MG PO TABS: 25.0000 mg | ORAL_TABLET | ORAL | Status: DC | PRN

## 2012-11-15 NOTE — Telephone Encounter (Signed)
Advised patient.  She says her head is still hurting, the shot didn't help. She will pick up medicine from pharmacy that you gave her this morning.  I asked her to call tomorrow morning with an update.

## 2012-11-15 NOTE — Addendum Note (Signed)
Addended by: Consuello Masse on: 11/15/2012 03:28 PM   Modules accepted: Orders

## 2012-11-15 NOTE — Patient Instructions (Signed)
Please make an appointment with your eye doctor. If your headaches do not improve and your eyes are ok, then we need to get MRI.  We are giving you Toradol injection.  You may pick up the imitrex from your pharmacy if you need it.

## 2012-11-15 NOTE — Telephone Encounter (Signed)
Noted! Thank you

## 2012-11-15 NOTE — Progress Notes (Signed)
Subjective:    Patient ID: Danielle Schneider, female    DOB: 1980/10/13, 32 y.o.   MRN: [id number]  HPI  Very pleasant 33 yo female here for:  1.  Ha- left sided frontal HA x 10 days.  Associated with sensitivity to light and nausea.  Has noticed that she feels a little "off balance." No vomiting.  Did have a h/o migraines years ago.  Has not had a dilated eye exam in several years.  2.  Anxiety- has been under more stress lately.  . Started Celexa 10 mg daily last month and feels much better.  Her husband has remarked that he feels it is working.  Less "snappy."  Patient Active Problem List  Diagnosis  . Anxiety  . Palpitations  . Fatigue  . Chest pain  . Shortness of breath  . Pharyngitis  . Lower back pain  . Osteitis pubis   Past Medical History  Diagnosis Date  . History of nephrolithiasis 02/2011  . Abnormal Pap smear   . Anal sphincter incontinence after fourth degree tear    resolved as of 2012  . Panic attack     history of   Past Surgical History  Procedure Date  . Shoulder open rotator cuff repair 2004  . Rib cage 2006    rib cage bone removal  . Cesarean section   . Post partum sterilization   . Shoulder surgery   . Thoracic outlet surgery    History  Substance Use Topics  . Smoking status: Former Smoker    Quit date: 04/16/2008  . Smokeless tobacco: Never Used  . Alcohol Use: No   Family History  Problem Relation Age of Onset  . Heart disease Father     MI in 35s   Allergies  Allergen Reactions  . Vicodin (Hydrocodone-Acetaminophen) Other (See Comments)    Nausea, dizziness, cold sweats   Current Outpatient Prescriptions on File Prior to Visit  Medication Sig Dispense Refill  . albuterol (PROVENTIL HFA;VENTOLIN HFA) 108 (90 BASE) MCG/ACT inhaler Inhale 2 puffs into the lungs every 6 (six) hours as needed for wheezing.  1 Inhaler  4  . Ascorbic Acid (VITAMIN C) 1000 MG tablet Take 1,000 mg by mouth daily.        . B Complex-C (B-COMPLEX WITH  VITAMIN C) tablet Take 1 tablet by mouth daily.        . citalopram (CELEXA) 10 MG tablet Take 1 tablet (10 mg total) by mouth daily.  30 tablet  1  . meloxicam (MOBIC) 7.5 MG tablet Take 7.5 mg by mouth as needed.      . Multiple Vitamin (MULTIVITAMIN) tablet Take 1 tablet by mouth daily.      Marland Kitchen pyridOXINE (VITAMIN B-6) 100 MG tablet Take 100 mg by mouth daily.         The PMH, PSH, Social History, Family History, Medications, and allergies have been reviewed in [facility], and have been updated if relevant.    Review of Systems    See HPI No other focal neurological symptoms Objective:   Physical Exam BP 110/72  Pulse 64  Temp 98 F (36.7 C)  Wt 184 lb (83.462 kg)  General:  Well-developed,well-nourished,in no acute distress; alert,appropriate and cooperative throughout examination Head:  normocephalic and atraumatic.   Eyes:  vision grossly intact, pupils equal, pupils round, and pupils reactive to light.   Ears:  R ear normal and L ear normal.   Lungs:  Normal respiratory effort, chest expands  symmetrically. Lungs are clear to auscultation, no crackles or wheezes. Heart:  Normal rate and regular rhythm. S1 and S2 normal without gallop, murmur, click, rub or other extra sounds. Msk:  No deformity or scoliosis noted of thoracic or lumbar spine.   Extremities:  No clubbing, cyanosis, edema, or deformity noted with normal full range of motion of all joints.   Neurologic:  alert & oriented X3 and gait normal.   Skin:  Intact without suspicious lesions or rashes Psych:  Cognition and judgment appear intact. Alert and cooperative with normal attention span and concentration. No apparent delusions, illusions, hallucinations      Assessment & Plan:   1. HA New- likely migrainous.  Given IM toradol in office, also given Rx for imitrex (she is s/p BTL).  If Toradol is not effective, she will take a dose of imitrex.  Also advised getting a dilated eye exam.  If symptoms do not improve and eye  exam is normal, consider MRI of brain to rule out malignancy although this is less likely. The patient indicates understanding of these issues and agrees with the plan.     2. Anxiety  Improved.  Continue celexa at current dose.  Rx refilled.

## 2012-11-15 NOTE — Telephone Encounter (Signed)
Left message asking patient to call back

## 2012-11-15 NOTE — Telephone Encounter (Signed)
Pt was seen earlier today and pt received injection for h/a; pt said how long does it take for med to help h/a? Med has not started working yet.

## 2012-11-15 NOTE — Telephone Encounter (Signed)
Should have starting working within 30 minutes to an hour.

## 2012-11-17 ENCOUNTER — Telehealth: Payer: Self-pay

## 2012-11-17 ENCOUNTER — Telehealth: Payer: Self-pay | Admitting: Family Medicine

## 2012-11-17 NOTE — Telephone Encounter (Signed)
Patient Information:  Caller Name: Sheyann  Phone: [phone]  Patient: Danielle Schneider  Gender: Female  DOB: April 09, 1981  Age: 32 Years  PCP: Ruthe Mannan [facility])  Pregnant: No  Office Follow Up:  Does the office need to follow up with this patient?: No  Instructions For The Office: N/A  RN Note: INORMATION PROVIDED TO PATIENT ON SIDE EFFECTS FROM  Toradol // side effects- Gastrointestinal (GI) experiences including: abdominal pain*, constipation/diarrhea,dyspepsia* flatulence,GI fullness,GI ulcers (gastric/duodenal),gross bleeding/perforation, Heartburn,nausea*stomatitis,Vomiting,Gastrointestinal (GI) experiences including: abdominal pain*,constipation/diarrhea,dyspepsia*,flatulence,GI fullness,GI ulcers (gastric/duodenal)gross bleeding/perforation,Heartburn,nausea*stomatitis,Vomiting  Symptoms  Reason For Call & Symptoms: Ongoing Headache -consistant since 11/05/12.  Hx of Migraines- different from her normal migraines.  Toradol 60mg  IM given in office and a Rx for Imitrex  She states the medication never helped.  Later in the evening on 11/15/12- she began to have side effects -stomach upset, headache, heartburn type feeling.  She called the office today and updated that the headache was not better . Dr. Dayton Martes advised to use Imitrex Rx  Reviewed Health History In EMR: Yes  Reviewed Medications In EMR: Yes  Reviewed Allergies In EMR: Yes  Reviewed Surgeries / Procedures: No  Date of Onset of Symptoms: 11/15/2012 OB / GYN:  LMP: 11/09/2012  Guideline(s) Used:  Headache  Disposition Per Guideline:   See Within 2 Weeks in Office  Reason For Disposition Reached:   Headache is a chronic symptom (recurrent or ongoing AND lasting > 4 weeks)  Advice Given:  Reassurance - Migraine Headache:  You have told me that this headache is similar to previous migraine headaches that you have had. If the pattern or severity of your headache changes, you will need to see your physician.  Migraine headaches are also called vascular headaches. A migraine can be anywhere from mild to severely painful. Sufferers often describe it as throbbing or pulsing. It is often just on one side. Associated symptoms include nausea and vomiting. Some individuals will have visual or other neurological warning symptoms (aura) that a migraine is coming.  This sounds like a painful headache that you are having, but there are pain medications you can take and other instructions I can give you to reduce the pain.  Rest:   Lie down in a dark, quiet place and try to relax. Close your eyes and imagine your entire body relaxing.  Apply Cold to the Area:   Apply a cold wet washcloth or cold pack to the forehead for 20 minutes.  Call Back If:  Headache lasts longer than 24 hours  You become worse.

## 2012-11-17 NOTE — Telephone Encounter (Signed)
Advised patient as instructed, she will get paper work.

## 2012-11-17 NOTE — Telephone Encounter (Signed)
Toradol could cause some heart burn and nausea.  Yes, I can fill out FMLA- she needs to be specific about dates off, etc.

## 2012-11-17 NOTE — Telephone Encounter (Signed)
Pt was to call back this morning with update; pt  thinks had side effect from Toradol; heart burn,chest pain,nausea and did not help h/a. Pt wants to know if this could be reaction to Toradol. Pt still has h/a. Pt has not been able to pick up Imitrex and will not be able to take today because she has to work at night and imitrex can cause drowsiness. Pt has eye appt on 12/07/12.Please advise.  Pt wants to know if Dr Dayton Martes will fill out FMLA papers; pt does not want to lose job and is not sure if will be able to go in to work tonight.Please advise.[facility] [address]

## 2012-11-20 ENCOUNTER — Encounter: Payer: Self-pay | Admitting: Family Medicine

## 2012-11-20 ENCOUNTER — Telehealth: Payer: Self-pay

## 2012-11-20 ENCOUNTER — Telehealth: Payer: Self-pay | Admitting: Family Medicine

## 2012-11-20 ENCOUNTER — Ambulatory Visit (INDEPENDENT_AMBULATORY_CARE_PROVIDER_SITE_OTHER): Payer: 59 | Admitting: Family Medicine

## 2012-11-20 VITALS — BP 100/60 | HR 78 | Temp 98.1°F | Ht 66.0 in | Wt 182.8 lb

## 2012-11-20 DIAGNOSIS — G43909 Migraine, unspecified, not intractable, without status migrainosus: Secondary | ICD-10-CM

## 2012-11-20 DIAGNOSIS — M542 Cervicalgia: Secondary | ICD-10-CM

## 2012-11-20 MED ORDER — SUMATRIPTAN SUCCINATE 100 MG PO TABS: 100.0000 mg | ORAL_TABLET | ORAL | Status: DC | PRN

## 2012-11-20 MED ORDER — AMITRIPTYLINE HCL 25 MG PO TABS: 25.0000 mg | ORAL_TABLET | Freq: Every day | ORAL | Status: DC

## 2012-11-20 NOTE — Telephone Encounter (Signed)
Patient Information:  Caller Name: Aamira  Phone: [phone]  Patient: Danielle Schneider, Danielle Schneider  Gender: Female  DOB: [birth year]  Age: 32 Years  PCP: Ruthe Mannan [facility])  Pregnant: No  Office Follow Up:  Does the office need to follow up with this patient?: No  Instructions For The Office: N/A   Symptoms  Reason For Call & Symptoms: Pt is calling back about a h/a she has had x 2 weeks. She was seen at the office on 2/5 - had a Toradol injection which did not help. She called back that day with side effects from the Toradol and ws instructed to start Immitrex with she has taken without results.   Pt used to get migraines in highschool and during her pregnancies. She has never had one this bad or this long. On arising today the pt has a stiff neck/her neck is painful on one side only. She cannot put her chin to her chest (but only on one side). Pt is afeb.  RN unable to schedule with Dr. Clifton Custard as pt cannot come to the opening at 3pm. Pt scheduled with Dr. Dallas Schimke.  Reviewed Health History In EMR: Yes  Reviewed Medications In EMR: Yes  Reviewed Allergies In EMR: Yes  Reviewed Surgeries / Procedures: Yes  Date of Onset of Symptoms: 11/06/2012  Treatments Tried: Toradol; Immitrex  Treatments Tried Worked: No OB / GYN:  LMP: 11/10/2012  Guideline(s) Used:  Headache  Disposition Per Guideline:   Go to ED Now (or to Office with PCP Approval)  Reason For Disposition Reached:   Severe headache, states "worst headache" of life  Advice Given:  N/A  RN Overrode Recommendation:  Make Appointment  Pt scheduled at the office per her request  Appointment Scheduled:  11/20/2012 12:15:00 Appointment Scheduled Provider:  Hannah Schneider (Family Practice)

## 2012-11-20 NOTE — Telephone Encounter (Signed)
Pt left v/m was seen today and when pt takes Imitrex 100 mg; pt thinks her h/a hurts worse.Pt wants to know if another mer could be sent to [facility] [address] instead of imitrex.Please advise.

## 2012-11-20 NOTE — Patient Instructions (Signed)
F/u 3 weeks with Dr. Dayton Martes

## 2012-11-20 NOTE — Progress Notes (Signed)
Nature conservation officer at [facility] [address] [place] [address] Phone: [phone] Fax: [phone]  Date:  11/20/2012   Name:  Danielle Schneider   DOB:  Sep 25, 1981   MRN:  [id number] Gender: female Age: 32 y.o.  Primary Physician:  Ruthe Mannan, MD  Evaluating MD: Hannah Beat, MD   Chief Complaint: Headache   History of Present Illness:  Danielle Schneider is a 32 y.o. pleasant patient who presents with the following:  Nurse Tech, all over the hospital.  Started 11/05/2012 - started around 5 Am and then eased away so that it is a dull annoyance, and it will get intense and then will get achy. The other day, sharp pain in her L side and will hurt mostly on the left side. This morning it went up and the neck on the left side is hurting. Tried icy hot, heat, and they will not help. R 2 fingers, 15-20 mins.   Sound +. Light+. No aura. Has been nauseous.  S/p BTL.  A lot of sleep lately.   25 mg of imitrex did not help.  Patient Active Problem List  Diagnosis  . Anxiety  . Palpitations  . Fatigue  . Chest pain  . Shortness of breath  . Pharyngitis  . Lower back pain  . Osteitis pubis  . Headache    Past Medical History  Diagnosis Date  . History of nephrolithiasis 02/2011  . Abnormal Pap smear   . Anal sphincter incontinence after fourth degree tear    resolved as of 2012  . Panic attack     history of    Past Surgical History  Procedure Laterality Date  . Shoulder open rotator cuff repair  2004  . Rib cage  2006    rib cage bone removal  . Cesarean section    . Post partum sterilization    . Shoulder surgery    . Thoracic outlet surgery      History  Substance Use Topics  . Smoking status: Former Smoker    Quit date: 04/16/2008  . Smokeless tobacco: Never Used  . Alcohol Use: No    Family History  Problem Relation Age of Onset  . Heart disease Father     MI in 12s    Allergies  Allergen Reactions  . Vicodin  (Hydrocodone-Acetaminophen) Other (See Comments)    Nausea, dizziness, cold sweats    Medication list has been reviewed and updated.  Outpatient Prescriptions Prior to Visit  Medication Sig Dispense Refill  . albuterol (PROVENTIL HFA;VENTOLIN HFA) 108 (90 BASE) MCG/ACT inhaler Inhale 2 puffs into the lungs every 6 (six) hours as needed for wheezing.  1 Inhaler  4  . Ascorbic Acid (VITAMIN C) 1000 MG tablet Take 1,000 mg by mouth daily.        . B Complex-C (B-COMPLEX WITH VITAMIN C) tablet Take 1 tablet by mouth daily.        . citalopram (CELEXA) 10 MG tablet Take 1 tablet (10 mg total) by mouth daily.  30 tablet  1  . meloxicam (MOBIC) 7.5 MG tablet Take 7.5 mg by mouth as needed.      . Multiple Vitamin (MULTIVITAMIN) tablet Take 1 tablet by mouth daily.      Marland Kitchen pyridOXINE (VITAMIN B-6) 100 MG tablet Take 100 mg by mouth daily.        . SUMAtriptan (IMITREX) 25 MG tablet Take 1 tablet (25 mg total) by mouth every 2 (two) hours  as needed for migraine.  10 tablet  0   No facility-administered medications prior to visit.    Review of Systems:   GEN: No acute illnesses, no fevers, chills. GI: No v/d, eating normally Pulm: No SOB Interactive and getting along well at home.  Otherwise, ROS is as per the HPI.   Physical Examination: BP 100/60  Pulse 78  Temp(Src) 98.1 F (36.7 C) (Oral)  Ht 5\' 6"  (1.676 m)  Wt 182 lb 12 oz (82.895 kg)  BMI 29.51 kg/m2  SpO2 96%  Ideal Body Weight: Weight in (lb) to have BMI = 25: 154.6   GEN: WDWN, NAD, Non-toxic, A & O x 3 HEENT: Atraumatic, Normocephalic. Neck supple. No masses, No LAD. Ears and Nose: No external deformity. CV: RRR, No M/G/R. No JVD. No thrill. No extra heart sounds. PULM: CTA B, no wheezes, crackles, rhonchi. No retractions. No resp. distress. No accessory muscle use. ABD: S, NT, ND, +BS. No rebound tenderness. No HSM.  EXTR: No c/c/e  Neuro: CN 2-12 grossly intact. PERRLA. EOMI. Sensation intact throughout. Str 5/5  all extremities. DTR 2+. No clonus. A and o x 4. Romberg neg. Finger nose neg. Heel -shin neg.   PSYCH: Normally interactive. Conversant. Not depressed or anxious appearing.  Calm demeanor.   Assessment and Plan:  1. Migraine, unspecified, without mention of intractable migraine without mention of status migrainosus   2. Cervicalgia    Decompensated migraine with normal neuro exam and 1 day of MSK neck pain. Heat, ROM, NSAIDS, massage. Increase Imitrex to 100 mg prn Add Elavil 25 mg at night  Recheck 3 weeks with Dr. Dayton Martes  Orders Today:  No orders of the defined types were placed in this encounter.    Updated Medication List: (Includes new medications, updates to list, dose adjustments) Meds ordered this encounter  Medications  . amitriptyline (ELAVIL) 25 MG tablet    Sig: Take 1 tablet (25 mg total) by mouth at bedtime.    Dispense:  30 tablet    Refill:  4  . SUMAtriptan (IMITREX) 100 MG tablet    Sig: Take 1 tablet (100 mg total) by mouth every 2 (two) hours as needed for migraine.    Dispense:  10 tablet    Refill:  3    Medications Discontinued: Medications Discontinued During This Encounter  Medication Reason  . SUMAtriptan (IMITREX) 25 MG tablet      Signed, Tityana Pagan T. Savilla Turbyfill, MD 11/20/2012 12:44 PM

## 2012-11-20 NOTE — Telephone Encounter (Signed)
LMOM

## 2012-11-22 MED ORDER — TIZANIDINE HCL 4 MG PO TABS: 4.0000 mg | ORAL_TABLET | Freq: Every evening | ORAL | Status: DC

## 2012-11-22 NOTE — Telephone Encounter (Signed)
Discussed, stop everything except elavil and start zanaflex at night   Hannah Beat, MD 11/22/2012, 8:15 AM

## 2012-11-30 ENCOUNTER — Telehealth: Payer: Self-pay | Admitting: *Deleted

## 2012-11-30 DIAGNOSIS — Z0279 Encounter for issue of other medical certificate: Secondary | ICD-10-CM

## 2012-11-30 NOTE — Telephone Encounter (Signed)
Pt dropped off FMLA papers for Dr. Clifton Custard.  Please call patient when ready for pickup at [phone].

## 2012-11-30 NOTE — Telephone Encounter (Signed)
Advised patient that forms are ready and that there is a $20.00 charge.

## 2012-11-30 NOTE — Telephone Encounter (Signed)
Forms are in your in box

## 2012-11-30 NOTE — Telephone Encounter (Signed)
Forms completed and in my box. 

## 2012-12-01 ENCOUNTER — Telehealth: Payer: Self-pay | Admitting: Family Medicine

## 2012-12-01 DIAGNOSIS — R51 Headache: Secondary | ICD-10-CM

## 2012-12-01 MED ORDER — IBUPROFEN 800 MG PO TABS: 800.0000 mg | ORAL_TABLET | Freq: Three times a day (TID) | ORAL | Status: DC | PRN

## 2012-12-01 NOTE — Telephone Encounter (Signed)
Advised patient.  She says she's still having headaches.  She requests a refill on sumatriptan, says she's having a hard time getting this refilled, will need a prior auth and I am waiting to receive form from the pharmacy.  States this does help her some.  She's asking if something similar can be prescribed until the prior auth can be done.  Also, she has appt on Monday for follow up, but, do you want her to have MRI before she follows up with you?  Please advise.

## 2012-12-01 NOTE — Telephone Encounter (Signed)
Please find out what her insurance prefers to cover.  I will place order for MRI.  Ok for her to wait to see me until after MRI.

## 2012-12-01 NOTE — Telephone Encounter (Signed)
Left message asking patient to call back

## 2012-12-01 NOTE — Telephone Encounter (Signed)
Rx sent 

## 2012-12-01 NOTE — Telephone Encounter (Signed)
Advised patient

## 2012-12-01 NOTE — Addendum Note (Signed)
Addended by: Dianne Dun on: 12/01/2012 02:34 PM   Modules accepted: Orders

## 2012-12-01 NOTE — Telephone Encounter (Signed)
Please call pt to let her know that I just received the note from her eye doctor stating that he found no abnormalities.  How is she feeling?   I would like to proceed with MRI if she is not feeling better.

## 2012-12-01 NOTE — Telephone Encounter (Signed)
Pt called back, states her insurance doesn't want to cover any migraine meds so patient is asking if she can have a script for ibuprofen 800 mg's.  She says she's taken this before and it does help a little- she's hoping she can get the prior auth done on the imitrex next week.  Uses [facility] [address].    Also she prefers to go to [facility] for the MRI, she works at [employer]

## 2012-12-01 NOTE — Addendum Note (Signed)
Addended by: Dianne Dun on: 12/01/2012 04:18 PM   Modules accepted: Orders

## 2012-12-04 ENCOUNTER — Telehealth: Payer: Self-pay | Admitting: *Deleted

## 2012-12-04 ENCOUNTER — Ambulatory Visit: Payer: 59 | Admitting: Family Medicine

## 2012-12-04 NOTE — Telephone Encounter (Signed)
Left message asking patient to call back regarding other meds she has tried.

## 2012-12-04 NOTE — Telephone Encounter (Signed)
Form signed and on my desk. 

## 2012-12-04 NOTE — Telephone Encounter (Signed)
Prior Danielle Schneider is needed for a increased quantity of sumatriptan.  Her insurance will only cover 9 a month and she sometimes needs one every 2 hours.  Form is on your desk.

## 2012-12-05 ENCOUNTER — Other Ambulatory Visit: Payer: Self-pay | Admitting: *Deleted

## 2012-12-05 MED ORDER — SUMATRIPTAN SUCCINATE 100 MG PO TABS: 100.0000 mg | ORAL_TABLET | ORAL | Status: DC | PRN

## 2012-12-06 ENCOUNTER — Ambulatory Visit (HOSPITAL_COMMUNITY)
Admission: RE | Admit: 2012-12-06 | Discharge: 2012-12-06 | Disposition: A | Discharge status: Home / Self Care | Payer: 59 | Source: Ambulatory Visit | Attending: Family Medicine | Admitting: Family Medicine

## 2012-12-06 DIAGNOSIS — H538 Other visual disturbances: Secondary | ICD-10-CM | POA: Insufficient documentation

## 2012-12-06 DIAGNOSIS — R259 Unspecified abnormal involuntary movements: Secondary | ICD-10-CM | POA: Insufficient documentation

## 2012-12-06 DIAGNOSIS — R110 Nausea: Secondary | ICD-10-CM | POA: Insufficient documentation

## 2012-12-06 DIAGNOSIS — R51 Headache: Secondary | ICD-10-CM | POA: Insufficient documentation

## 2012-12-06 DIAGNOSIS — F29 Unspecified psychosis not due to a substance or known physiological condition: Secondary | ICD-10-CM | POA: Insufficient documentation

## 2012-12-07 ENCOUNTER — Telehealth: Payer: Self-pay | Admitting: *Deleted

## 2012-12-07 DIAGNOSIS — G43909 Migraine, unspecified, not intractable, without status migrainosus: Secondary | ICD-10-CM

## 2012-12-07 NOTE — Telephone Encounter (Signed)
Pt is asking if her headaches could have something to do with some type of deficiency.  Or sinus problems.  She says she's grasping at straws but she is trying to figure out what could be wrong with her.  Questions if the fall she had could be causing the headaches, since she also still gets some dizziness.  Or if they could be be coming from her ears.  Please advise on what you think could be going on.

## 2012-12-07 NOTE — Telephone Encounter (Signed)
They seem like migraines and I do not think they are caused by a deficiency.  Sinus issues can trigger migraines.  Would she like a referral to a neurologist for further evaluation?

## 2012-12-07 NOTE — Telephone Encounter (Signed)
Prior auth form faxed

## 2012-12-08 NOTE — Telephone Encounter (Signed)
Advised patient.  She does want referral to neurologist.  Prefers to see someone in [place].

## 2012-12-08 NOTE — Telephone Encounter (Signed)
Referral placed.

## 2012-12-25 ENCOUNTER — Other Ambulatory Visit: Payer: Self-pay | Admitting: Neurology

## 2012-12-25 DIAGNOSIS — R51 Headache: Secondary | ICD-10-CM

## 2012-12-28 ENCOUNTER — Other Ambulatory Visit: Payer: Self-pay

## 2013-01-03 ENCOUNTER — Ambulatory Visit (INDEPENDENT_AMBULATORY_CARE_PROVIDER_SITE_OTHER): Payer: Self-pay

## 2013-01-03 ENCOUNTER — Ambulatory Visit (INDEPENDENT_AMBULATORY_CARE_PROVIDER_SITE_OTHER): Payer: 59

## 2013-01-03 ENCOUNTER — Other Ambulatory Visit: Payer: Self-pay

## 2013-01-03 DIAGNOSIS — R51 Headache: Secondary | ICD-10-CM

## 2013-01-03 MED ORDER — GADOPENTETATE DIMEGLUMINE 469.01 MG/ML IV SOLN: 17.0000 mL | Freq: Once | INTRAVENOUS | Status: AC | PRN

## 2013-01-05 MED ORDER — GADOPENTETATE DIMEGLUMINE 469.01 MG/ML IV SOLN: 17.0000 mL | Freq: Once | INTRAVENOUS | Status: AC | PRN

## 2013-01-06 NOTE — Procedures (Signed)
[facility]  NEUROIMAGING REPORT   STUDY DATE: 01/03/2013 PATIENT NAME: SEMIRA STOLTZFUS DOB: 05/30/1981 MRN: [id number]  ORDERING CLINICIAN: Dr Pearlean Brownie CLINICAL HISTORY: 32 year-old patient being evaluated for headache and neck pain  EXAM: MRI cervical spine with and without TECHNIQUE: MRI scan of the cervical spine was performed on a 1.5 Tesla magnet and sagittal T1, T2, STIR and axial T2 and gradient-echo sequences as well as post contrast sagittal and T1 axial images were obtained as well. No comparison films are available. CONTRAST: 17 ml Magnevist IMAGING SITE: [facility]  FINDINGS:  The cervical vertebrae demonstrate abnormal  alignment with posterior subluxation of C3 and 4 over C2 vertebrae but normal body height and bone marrow signal characteristics. The intervertebral discs show normal signal characteristics without any frank disc herniation, cord compression, root or foraminal impingement.The spinal cord parenchyma shows normal signal characteristics. Contrast images do not result in any abnormal areas of enhancement. The visualized portion of the lower brain stem, cerebellum and upper thoracic spine also appear unremarkable.  IMPRESSION:   Normal MRI scan of the cervical spine with and without contrast  INTERPRETING PHYSICIAN:  Delia Heady, MD Certified in Neurology,Vascular neurology and Neuroimaging  [facility] [address] [place], [place] [address] [phone]

## 2013-01-06 NOTE — Procedures (Signed)
[facility]  NEUROIMAGING REPORT   STUDY DATE: 01/03/2013 PATIENT NAME: Danielle Schneider DOB: [birth year] MRN: [id number]  ORDERING CLINICIAN: Sr Trae Bovenzi CLINICAL HISTORY: 32 year old patient being evaluated for headaches  EXAM: MRI Brain &  IAC with and without TECHNIQUE: MRI scan of the brain and IAC was performed a 1.5 Tesla magnet and sagittal T1,axial T2, T1, flair, gradient echo images were obtained from the skull base to the vertex. Thin 3 mm axial and coronal T1 images were obtained pre-and postcontrast to the internal artery canals as well. CONTRAST: 17 mL Magnevist IMAGING SITE: [facility]  FINDINGS:  The brain parenchyma shows no abnormal signal intensities. No structural lesion, tumor infarcts are noted. Diffusion weighted imaging is negative for acute ischemia. Extraction brain structures appear normal. Orbits appear unremarkable. Paranasal sinuses show only minor mucosal thickening. PICU PICC line and cerebellar tonsils appear normal. Visualized portion of the upper cervical spine appears unremarkable. Flow-voids of the large vessels of the retinal circulation appear normal. Post contrast images do not result in any abnormal areas of enhancement.  Thin sections through the internal artery canal show normal appearance of the eighth nerve complex without any structural lesions, tumors or abnormal vessels noted.    IMPRESSION:    Normal MRI scan of the brain and internal artery canals with and without contrast   INTERPRETING PHYSICIAN:  Delia Heady, MD Certified in Neurology,Vascular neurology and Neuroimaging  [facility] [address] [place], [place] [address] [phone]

## 2013-01-06 NOTE — Procedures (Signed)
[facility]  NEUROIMAGING REPORT   STUDY DATE: 01/03/2013 PATIENT NAME: Danielle Schneider DOB: 15-Jul-1981 MRN: [id number]  ORDERING CLINICIAN: Dr Pearlean Brownie CLINICAL HISTORY: 31 patient being evaluated for headache  EXAM: MRA of the brain without TECHNIQUE:  CONTRAST: MRA of the brain is performed on a 1.5 Tesla magnet and 2-D time of flight images were obtained and reconstructed to create the angiographic appearance of the vessels of the circle of Willis IMAGING SITE: [facility]  FINDINGS:  Both internal carotid arteries demonstrate normal flow and caliber in the appendectomy crest, cavernous and terminal intracranial portions. Middle and anterior cerebral arteries show normal flow and caliber. Both vertebral arteries conjugate to the basilar artery though the left vertebral appears to be hypoplastic terminally. The proximal portion of the basilar artery has a small fenestration. The P1 segment of the left posterior cerebral artery appears to be hypoplastic and this vessel fills predominantly from the anterior circulation suggesting persistent fetal origin. 2 no definite visualized aneurysms noted 2 aneurysm smaller than 3 mm in size are not well seen on MRA due to technical limitations.  IMPRESSION:   This MRA of the brain shows no significant stenosis of the medium and large size intracranial vessels. 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  INTERPRETING PHYSICIAN:  Delia Heady, MD Certified in Neurology,Vascular neurology and Neuroimaging  [facility] [address] [place], [place] [address] [phone]

## 2013-01-11 ENCOUNTER — Telehealth: Payer: Self-pay | Admitting: Neurology

## 2013-01-11 DIAGNOSIS — R42 Dizziness and giddiness: Secondary | ICD-10-CM

## 2013-01-11 NOTE — Telephone Encounter (Signed)
I spoke to her and communicated results of lab work, MRA and MRI scan of the neck all being essentially normal. She complains of dizziness which is positional and transient which is bothersome. I recommend referral for vestibular rehabilitation.  for dizziness exercises

## 2013-02-12 ENCOUNTER — Other Ambulatory Visit: Payer: Self-pay

## 2013-02-12 MED ORDER — CITALOPRAM HYDROBROMIDE 10 MG PO TABS: 10.0000 mg | ORAL_TABLET | Freq: Every day | ORAL | Status: DC

## 2013-02-12 NOTE — Telephone Encounter (Signed)
Pt left v/m requesting refill celexa to [facility] [address]. Pt seen 11/20/12 and  f/u appt on 12/04/12 cancelled.Please advise.

## 2013-06-01 ENCOUNTER — Ambulatory Visit (INDEPENDENT_AMBULATORY_CARE_PROVIDER_SITE_OTHER): Payer: 59 | Admitting: Family Medicine

## 2013-06-01 ENCOUNTER — Encounter: Payer: Self-pay | Admitting: Family Medicine

## 2013-06-01 VITALS — BP 108/72 | HR 77 | Temp 98.2°F | Ht 66.0 in | Wt 184.5 lb

## 2013-06-01 DIAGNOSIS — G43909 Migraine, unspecified, not intractable, without status migrainosus: Secondary | ICD-10-CM

## 2013-06-01 MED ORDER — PROMETHAZINE HCL 50 MG/ML IJ SOLN
50.0000 mg | Freq: Once | INTRAMUSCULAR | Status: AC
  Administered 2013-06-01: 50 mg via INTRAMUSCULAR

## 2013-06-01 MED ORDER — PROMETHAZINE HCL 25 MG PO TABS: 25.0000 mg | ORAL_TABLET | Freq: Three times a day (TID) | ORAL | Status: DC | PRN

## 2013-06-01 MED ORDER — RIZATRIPTAN BENZOATE 10 MG PO TABS: 10.0000 mg | ORAL_TABLET | ORAL | Status: DC | PRN

## 2013-06-01 NOTE — Progress Notes (Signed)
Subjective:    Patient ID: Danielle Schneider, female    DOB: 22-Apr-1981, 32 y.o.   MRN: [id number]  HPI Here for a migraine since Sunday  Hx of migraines - and has had one headache that lasted 3 mo- was worked up for it   Had been a while since she has had one that lasted this long   Has seen a neurologist in the past -did not keep going   She has amitriptyline- for a short period of time -- did not really help   This headache started on the R side and now spread across  ? Trigger  Her aura is dizziness and nausea (and sees shadows) Was up all night with the headache  8/10 on the pain scale- is constant  Is nauseated/ not vomiting  Is sensitive to light and sound - but gets used to that   Generally has a bad headache twice a week - lasting 1-5 hours or all night   She has taken tramadol for migraine - helps for the more minor symptoms  Also takes motrin 800 mg  zanaflex at night for headache  She has done toradol in the past- makes her worse  Has never had phenergan before   Patient Active Problem List   Diagnosis Date Noted  . Migraine, unspecified, without mention of intractable migraine without mention of status migrainosus 11/15/2012  . 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 Lower back pain 06/09/2012  . Pharyngitis 05/29/2012  . Chest pain 05/11/2012  . Shortness of breath 05/11/2012  . Palpitations 01/27/2012  . Fatigue 01/27/2012  . Anxiety 05/26/2011   Past Medical History  Diagnosis Date  . History of nephrolithiasis 02/2011  . Abnormal Pap smear   . Anal sphincter incontinence after fourth degree tear    resolved as of 2012  . Panic attack     history of   Past Surgical History  Procedure Laterality Date  . Shoulder open rotator cuff repair  2004  . Rib cage  2006    rib cage bone removal  . Cesarean section    . Post partum sterilization    . Shoulder surgery    . Thoracic outlet surgery     History  Substance Use Topics  . Smoking status: Former Smoker     Quit date: 04/16/2008  . Smokeless tobacco: Never Used  . Alcohol Use: No   Family History  Problem Relation Age of Onset  . Heart disease Father     MI in 81s   Allergies  Allergen Reactions  . Vicodin [Hydrocodone-Acetaminophen] Other (See Comments)    Nausea, dizziness, cold sweats   Current Outpatient Prescriptions on File Prior to Visit  Medication Sig Dispense Refill  . albuterol (PROVENTIL HFA;VENTOLIN HFA) 108 (90 BASE) MCG/ACT inhaler Inhale 2 puffs into the lungs every 6 (six) hours as needed for wheezing.  1 Inhaler  4  . Ascorbic Acid (VITAMIN C) 1000 MG tablet Take 1,000 mg by mouth daily.        . B Complex-C (B-COMPLEX WITH VITAMIN C) tablet Take 1 tablet by mouth daily.        Marland Kitchen ibuprofen (ADVIL,MOTRIN) 800 MG tablet Take 1 tablet (800 mg total) by mouth every 8 (eight) hours as needed for pain.  30 tablet  0  . Multiple Vitamin (MULTIVITAMIN) tablet Take 1 tablet by mouth daily.      Marland Kitchen pyridOXINE (VITAMIN B-6) 100 MG tablet Take 100 mg by mouth daily.        Marland Kitchen  amitriptyline (ELAVIL) 25 MG tablet Take 1 tablet (25 mg total) by mouth at bedtime.  30 tablet  4   No current facility-administered medications on file prior to visit.      Review of Systems Review of Systems  Constitutional: Negative for fever, appetite change, fatigue and unexpected weight change.  Eyes: Negative for pain and visual disturbance.  Respiratory: Negative for cough and shortness of breath.   Cardiovascular: Negative for cp or palpitations    Gastrointestinal: Negative for nausea, diarrhea and constipation.  Genitourinary: Negative for urgency and frequency.  Skin: Negative for pallor or rash   Neurological: Negative for weakness, light-headedness, numbness and pos for headache  Hematological: Negative for adenopathy. Does not bruise/bleed easily.  Psychiatric/Behavioral: Negative for dysphoric mood. The patient is not nervous/anxious.         Objective:   Physical Exam   Constitutional: She appears well-developed and well-nourished. No distress.  overwt and well app  HENT:  Head: Normocephalic and atraumatic.  Right Ear: External ear normal.  Left Ear: External ear normal.  Nose: Nose normal.  Mouth/Throat: Oropharynx is clear and moist.  No sinus tenderness  Eyes: Conjunctivae and EOM are normal. Pupils are equal, round, and reactive to light. No scleral icterus.  Neck: Normal range of motion. Neck supple. No JVD present. Carotid bruit is not present. No thyromegaly present.  Cardiovascular: Normal rate and regular rhythm.   Pulmonary/Chest: Effort normal and breath sounds normal.  Abdominal: She exhibits no abdominal bruit.  Musculoskeletal: She exhibits no edema.  Lymphadenopathy:    She has no cervical adenopathy.  Neurological: She is alert. She has normal strength and normal reflexes. She displays no atrophy and no tremor. No cranial nerve deficit or sensory deficit. She exhibits normal muscle tone. Coordination and gait normal.  No cerebellar signs  Skin: Skin is warm and dry. No rash noted. No erythema. No pallor.  Psychiatric: She has a normal mood and affect.          Assessment & Plan:

## 2013-06-01 NOTE — Patient Instructions (Addendum)
Drink lots of water  Phenergan injection now  Go home - take a maxalt and sleep  Follow up with your primary doctor about migraine plan/ prophylaxis  Continue ibuprofen with food as needed as well

## 2013-06-03 NOTE — Assessment & Plan Note (Signed)
In the long term I think pt will need prophylaxis  Disc imp of lifestyle change  Phenergan inj today Trial of maxalt to abort this headache  Enc f/u with PCP

## 2013-06-04 ENCOUNTER — Telehealth: Payer: Self-pay | Admitting: Family Medicine

## 2013-06-04 NOTE — Telephone Encounter (Signed)
[facility] Triage Call Report Triage Record Num: [id number] Operator: Remonia Richter Patient Name: Danielle Schneider Call Date & Time: 06/02/2013 8:19:12PM Patient Phone: [phone] PCP: Ruthe Mannan Patient Gender: Female PCP Fax : [phone] Patient DOB: 05/05/1981 Practice Name: [facility] Reason for Call: Caller: [place]/Patient; PCP: Ruthe Mannan (Family Practice); CB#: [phone]; Call regarding Nausea, weakness, fatigue, headache; she had Phenergan injection in office 06/01/13 for headache and she thinks she is having a reaction to the shot, with the weakness and fatigue, headache still present 6/10 on pain scale, took Tramadol 50mg  at 18:22 with minimal relief, LMP 05/16/13, cant think straight ,moving slow when up with new nausea, 911 didposition obtained per Headache Guideline, advice given to caller per disposition Protocol(s) Used: Headache Recommended Outcome per Protocol: Activate EMS 911 Reason for Outcome: Sudden change in mental status or new change in speech Care Advice: ~ IMMEDIATE ACTION An adult should stay with the patient, preferably one trained in CPR. If the person is not trained in CPR, then he or she should provide hands-only (compression-only) CPR as recommended by the American Heart Association. ~ 06/02/2013 8:32:18PM Page 1 of 1 [email or web address]

## 2013-06-19 ENCOUNTER — Ambulatory Visit (INDEPENDENT_AMBULATORY_CARE_PROVIDER_SITE_OTHER): Payer: 59 | Admitting: Family Medicine

## 2013-06-19 ENCOUNTER — Encounter: Payer: Self-pay | Admitting: Family Medicine

## 2013-06-19 VITALS — BP 102/62 | HR 68 | Temp 98.0°F | Wt 181.0 lb

## 2013-06-19 DIAGNOSIS — G43909 Migraine, unspecified, not intractable, without status migrainosus: Secondary | ICD-10-CM

## 2013-06-19 DIAGNOSIS — M545 Low back pain, unspecified: Secondary | ICD-10-CM

## 2013-06-19 LAB — POCT URINALYSIS DIPSTICK
Bilirubin, UA: NEGATIVE
Nitrite, UA: NEGATIVE
Protein, UA: NEGATIVE
pH, UA: 6

## 2013-06-19 MED ORDER — TOPIRAMATE 25 MG PO TABS: 25.0000 mg | ORAL_TABLET | Freq: Every day | ORAL | Status: DC

## 2013-06-19 NOTE — Progress Notes (Signed)
Subjective:    Patient ID: Danielle Schneider, female    DOB: 06-Mar-1981, 32 y.o.   MRN: [id number]  HPI 32 yo pleasant female with h/o migraines here for:  Migraine headaches- deteriorating.    Has seen a neurologist in the past ( Dr. Pearlean Brownie).  Only saw him once.  He advised that she stop taking amitriptyline.  She felt it was not helping.  imitrex and maxalt do not help, infact maxalt made her body "feel heavy."  Having at least 3 migraines per month that typically last for days.  Her aura is dizziness and nausea (and sees shadows) Has nausea without vomiting typically   She has taken tramadol for migraine - helps for the more minor symptoms  Also takes motrin 800 mg  zanaflex at night for headache  Toradol made her sick.  Also having a little left sided back pain.  No radiculopathy.  Thinks she pulled something but did have some intermittent mild dysuria past few days.  No fevers, nausea, vomiting or increased urinary frequency.  Patient Active Problem List   Diagnosis Date Noted  . Migraine, unspecified, without mention of intractable migraine without mention of status migrainosus 11/15/2012  . 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 Lower back pain 06/09/2012  . Pharyngitis 05/29/2012  . Chest pain 05/11/2012  . Shortness of breath 05/11/2012  . Palpitations 01/27/2012  . Fatigue 01/27/2012  . Anxiety 05/26/2011   Past Medical History  Diagnosis Date  . History of nephrolithiasis 02/2011  . Abnormal Pap smear   . Anal sphincter incontinence after fourth degree tear    resolved as of 2012  . Panic attack     history of   Past Surgical History  Procedure Laterality Date  . Shoulder open rotator cuff repair  2004  . Rib cage  2006    rib cage bone removal  . Cesarean section    . Post partum sterilization    . Shoulder surgery    . Thoracic outlet surgery     History  Substance Use Topics  . Smoking status: Former Smoker    Quit date: 04/16/2008  . Smokeless tobacco:  Never Used  . Alcohol Use: No   Family History  Problem Relation Age of Onset  . Heart disease Father     MI in 41s   Allergies  Allergen Reactions  . Vicodin [Hydrocodone-Acetaminophen] Other (See Comments)    Nausea, dizziness, cold sweats   Current Outpatient Prescriptions on File Prior to Visit  Medication Sig Dispense Refill  . albuterol (PROVENTIL HFA;VENTOLIN HFA) 108 (90 BASE) MCG/ACT inhaler Inhale 2 puffs into the lungs every 6 (six) hours as needed for wheezing.  1 Inhaler  4  . Ascorbic Acid (VITAMIN C) 1000 MG tablet Take 1,000 mg by mouth daily.        . B Complex-C (B-COMPLEX WITH VITAMIN C) tablet Take 1 tablet by mouth daily.        Marland Kitchen ibuprofen (ADVIL,MOTRIN) 800 MG tablet Take 1 tablet (800 mg total) by mouth every 8 (eight) hours as needed for pain.  30 tablet  0  . Multiple Vitamin (MULTIVITAMIN) tablet Take 1 tablet by mouth daily.      Marland Kitchen pyridOXINE (VITAMIN B-6) 100 MG tablet Take 100 mg by mouth daily.        Marland Kitchen tiZANidine (ZANAFLEX) 4 MG tablet Take 4 mg by mouth at bedtime as needed.      . traMADol (ULTRAM) 50 MG tablet Take 100 mg by  mouth 2 (two) times daily as needed for pain.      . promethazine (PHENERGAN) 25 MG tablet Take 1 tablet (25 mg total) by mouth every 8 (eight) hours as needed for nausea (with migraine).  20 tablet  0   No current facility-administered medications on file prior to visit.      Review of Systems See HPI       Objective:   Physical Exam  BP 102/62  Pulse 68  Temp(Src) 98 F (36.7 C)  Wt 181 lb (82.101 kg)  BMI 29.23 kg/m2  LMP 05/16/2013  Constitutional: She appears well-developed and well-nourished. No distress.  HENT:  Head: Normocephalic and atraumatic.  Right Ear: External ear normal.  Left Ear: External ear normal.  Nose: Nose normal.  Mouth/Throat: Oropharynx is clear and moist.  Eyes: Conjunctivae and EOM are normal. Pupils are equal, round, and reactive to light. No scleral icterus.  Neck: Normal range  of motion. Neck supple. No JVD present. Carotid bruit is not present. No thyromegaly present.  Cardiovascular: Normal rate and regular rhythm.   Pulmonary/Chest: Effort normal and breath sounds normal.  Abdominal: She exhibits no abdominal bruit.  Musculoskeletal: She exhibits no edema.  No TTP over spine, Neg SLR bilaterally Lymphadenopathy:    She has no cervical adenopathy.  Neurological: She is alert. She has normal strength and normal reflexes. She displays no atrophy and no tremor. No cranial nerve deficit or sensory deficit. She exhibits normal muscle tone. Coordination and gait normal.  Skin: Skin is warm and dry. No rash noted. No erythema. No pallor.  Psychiatric: She has a normal mood and affect.      Assessment & Plan:  1. Migraine, unspecified, without mention of intractable migraine without mention of status migrainosus Deteriorated.  Will start topamax for prophylaxis- 25 mg before bed.  She is aware that it is not safe to take during pregnancy. Continue tramadol, zanaflex as needed.  2. Lower back pain UA pos for RBCs only but she is on her period.  Will send for cx to rule out infection but likely MSK. - POCT urinalysis dipstick - Urine culture

## 2013-06-19 NOTE — Patient Instructions (Addendum)
Good to see you. We are starting Topamax 25 mg before bedtime. Please call me with an update.

## 2013-09-18 ENCOUNTER — Ambulatory Visit (INDEPENDENT_AMBULATORY_CARE_PROVIDER_SITE_OTHER): Payer: 59 | Admitting: Family Medicine

## 2013-09-18 ENCOUNTER — Encounter: Payer: Self-pay | Admitting: Family Medicine

## 2013-09-18 VITALS — BP 80/60 | HR 62 | Temp 98.0°F | Ht 66.0 in | Wt 168.2 lb

## 2013-09-18 DIAGNOSIS — R21 Rash and other nonspecific skin eruption: Secondary | ICD-10-CM

## 2013-09-18 MED ORDER — TOPIRAMATE 25 MG PO TABS: 25.0000 mg | ORAL_TABLET | Freq: Every day | ORAL | Status: DC

## 2013-09-18 MED ORDER — TRIAMCINOLONE ACETONIDE 0.1 % EX CREA: 1.0000 "application " | TOPICAL_CREAM | Freq: Two times a day (BID) | CUTANEOUS | Status: DC

## 2013-09-18 MED ORDER — TIZANIDINE HCL 4 MG PO TABS: 4.0000 mg | ORAL_TABLET | Freq: Every evening | ORAL | Status: DC | PRN

## 2013-09-18 NOTE — Progress Notes (Signed)
Pre-visit discussion using our clinic review tool. No additional management support is needed unless otherwise documented below in the visit note.  

## 2013-09-18 NOTE — Patient Instructions (Signed)
Start zyrtec daily at bedtime.  Apply triamcinolone cream twice daily to affected areas. Stop new Neutrogena make up.  Call if not improving in 2 weeks for referral to derm.

## 2013-09-18 NOTE — Progress Notes (Signed)
   Subjective:    Patient ID: Danielle Schneider, female    DOB: 1980-10-22, 32 y.o.   MRN: [id number]  HPI  32 year old female pt of Dr. Dayton Martes presents with redness and swelling under  and around eyes B. She has had several flares in last months. In last 3 months .  She feels there may be association with food because it seemed to happen more after thanksgiving meal or eating out. She states the area above and below eyes is sore, minimally itchy. She does have some watery eyes, but not itchy, no pus discharge.. No sneezing. occ has noted red dry areas at sides of mouth. Has seasonal allergies... But fairly well controlled right now... Mild cough, post nasal drip. No fever.   She has been applying an oil. She has applied cold cloth with vinegar at times.   She has history of psoriasis on scalp... Uses shampoo for this.  She has started using neutrogena liquid foundation     Review of Systems  Constitutional: Negative for fever and fatigue.  HENT: Negative for ear pain.   Eyes: Negative for pain and redness.  Respiratory: Negative for cough and shortness of breath.   Cardiovascular: Negative for chest pain.  Gastrointestinal: Negative for abdominal pain.       Objective:   Physical Exam  Constitutional: Vital signs are normal. She appears well-developed and well-nourished. She is cooperative.  Non-toxic appearance. She does not appear ill. No distress.  HENT:  Head: Normocephalic.  Right Ear: Hearing, tympanic membrane, external ear and ear canal normal. Tympanic membrane is not erythematous, not retracted and not bulging.  Left Ear: Hearing, tympanic membrane, external ear and ear canal normal. Tympanic membrane is not erythematous, not retracted and not bulging.  Nose: Mucosal edema and rhinorrhea present. Right sinus exhibits no maxillary sinus tenderness and no frontal sinus tenderness. Left sinus exhibits no maxillary sinus tenderness and no frontal sinus tenderness.    Mouth/Throat: Uvula is midline, oropharynx is clear and moist and mucous membranes are normal.  Eyes: Conjunctivae, EOM and lids are normal. Pupils are equal, round, and reactive to light. Lids are everted and swept, no foreign bodies found.    Redness and swelling above and below eye  PERRLA, EOMI, no redness of conjunctiva.  Neck: Trachea normal and normal range of motion. Neck supple. Carotid bruit is not present. No mass and no thyromegaly present.  Cardiovascular: Normal rate, regular rhythm, S1 normal, S2 normal, normal heart sounds, intact distal pulses and normal pulses.  Exam reveals no gallop and no friction rub.   No murmur heard. Pulmonary/Chest: Effort normal and breath sounds normal. Not tachypneic. No respiratory distress. She has no decreased breath sounds. She has no wheezes. She has no rhonchi. She has no rales.  Neurological: She is alert.  Skin: Skin is warm, dry and intact. No rash noted.  Psychiatric: Her speech is normal and behavior is normal. Judgment normal. Her mood appears not anxious. Cognition and memory are normal. She does not exhibit a depressed mood.          Assessment & Plan:

## 2013-09-18 NOTE — Assessment & Plan Note (Signed)
Looks most consistent with irritant dermatitis but psoriasis is possible.  Start with topical steroid cream and oral antihistamine.  Call if not improving for derm referral.

## 2013-09-21 ENCOUNTER — Ambulatory Visit (INDEPENDENT_AMBULATORY_CARE_PROVIDER_SITE_OTHER): Payer: 59 | Admitting: Obstetrics and Gynecology

## 2013-09-21 ENCOUNTER — Encounter: Payer: Self-pay | Admitting: Obstetrics and Gynecology

## 2013-09-21 VITALS — BP 125/83 | HR 72 | Ht 65.0 in | Wt 169.0 lb

## 2013-09-21 DIAGNOSIS — Z01419 Encounter for gynecological examination (general) (routine) without abnormal findings: Secondary | ICD-10-CM

## 2013-09-21 DIAGNOSIS — Z1151 Encounter for screening for human papillomavirus (HPV): Secondary | ICD-10-CM

## 2013-09-21 DIAGNOSIS — Z124 Encounter for screening for malignant neoplasm of cervix: Secondary | ICD-10-CM

## 2013-09-21 NOTE — Progress Notes (Signed)
  Subjective:     Danielle Schneider is a 32 y.o. female with LMP 09/07/2013 and BMI 28 who is here for a comprehensive physical exam. The patient reports some vaginal irritation with intercourse. She feels that she is not lubricated enough. She has tried KY jelly and felt that the irritation was worst. Patient is otherwise doing well and without complaints.   History   Social History  . Marital Status: Married    Spouse Name: N/A    Number of Children: N/A  . Years of Education: N/A   Occupational History  . Not on file.   Social History Main Topics  . Smoking status: Former Smoker    Quit date: 04/16/2008  . Smokeless tobacco: Never Used  . Alcohol Use: Yes     Comment: occasionally  . Drug Use: No  . Sexual Activity: Not Currently    Birth Control/ Protection: Surgical   Other Topics Concern  . Not on file   Social History Narrative  . No narrative on file   Health Maintenance  Topic Date Due  . Tetanus/tdap  09/18/1999  . Pap Smear  03/11/2012  . Influenza Vaccine  05/11/2013   Past Medical History  Diagnosis Date  . History of nephrolithiasis 02/2011  . Abnormal Pap smear   . Anal sphincter incontinence after fourth degree tear    resolved as of 2012  . Panic attack     history of   Past Surgical History  Procedure Laterality Date  . Shoulder open rotator cuff repair  2004  . Rib cage  2006    rib cage bone removal  . Cesarean section    . Post partum sterilization    . Shoulder surgery    . Thoracic outlet surgery     Family History  Problem Relation Age of Onset  . Heart disease Father     MI in 20s   History  Substance Use Topics  . Smoking status: Former Smoker    Quit date: 04/16/2008  . Smokeless tobacco: Never Used  . Alcohol Use: Yes     Comment: occasionally       Review of Systems A comprehensive review of systems was negative.   Objective:      GENERAL: Well-developed, well-nourished female in no acute distress.  HEENT:  Normocephalic, atraumatic. Sclerae anicteric.  NECK: Supple. Normal thyroid.  LUNGS: Clear to auscultation bilaterally.  HEART: Regular rate and rhythm. BREASTS: Symmetric in size. No palpable masses or lymphadenopathy, skin changes, or nipple drainage. ABDOMEN: Soft, nontender, nondistended. No organomegaly. PELVIC: Normal external female genitalia. Vagina is pink and rugated.  Normal discharge. Normal appearing cervix. Uterus is normal in size. No adnexal mass or tenderness. EXTREMITIES: No cyanosis, clubbing, or edema, 2+ distal pulses.    Assessment:    Healthy female exam.      Plan:    pap smear collected Patient advised to perform monthly self breast and vulva exam Advised to experiment with different types of lubricants as she doesn't seem to have a reaction to lubricant used during pelvic exam See After Visit Summary for Counseling Recommendations

## 2013-10-10 ENCOUNTER — Ambulatory Visit (INDEPENDENT_AMBULATORY_CARE_PROVIDER_SITE_OTHER): Payer: 59 | Admitting: Family Medicine

## 2013-10-10 ENCOUNTER — Encounter: Payer: Self-pay | Admitting: Family Medicine

## 2013-10-10 VITALS — BP 102/64 | HR 66 | Temp 98.1°F | Ht 66.0 in | Wt 165.5 lb

## 2013-10-10 DIAGNOSIS — G43909 Migraine, unspecified, not intractable, without status migrainosus: Secondary | ICD-10-CM

## 2013-10-10 MED ORDER — TOPIRAMATE 50 MG PO TABS: 50.0000 mg | ORAL_TABLET | Freq: Every day | ORAL | Status: DC

## 2013-10-10 NOTE — Progress Notes (Signed)
Pre-visit discussion using our clinic review tool. No additional management support is needed unless otherwise documented below in the visit note.  

## 2013-10-10 NOTE — Patient Instructions (Signed)
Good to see you. Happy New year. We are increasing your Topamax to 50-100 mg daily Please call me or send me a mychart message with an update.

## 2013-10-10 NOTE — Progress Notes (Signed)
Subjective:    Patient ID: Danielle Schneider, female    DOB: 1981-07-22, 32 y.o.   MRN: [id number]  HPI 32 yo pleasant female with h/o migraines here for:  Migraine headaches- deteriorating.    Has seen a neurologist in the past ( Dr. Pearlean Brownie).  Only saw him once.  He advised that she stop taking amitriptyline.  She felt it was not helping.  imitrex and maxalt do not help, in fact maxalt made her body "feel heavy."  Started topamax 25 mg daily in 06/2013.  Initially felt it was working- had more energy, migraines decreased to 1 every 2 weeks and would not last as long.  Past month,migraines increasing to several per week- also lasting for several days.    Her aura is dizziness and nausea (and sees shadows) Has nausea without vomiting typically   Has not noticed any side effects from the topamax.    Patient Active Problem List   Diagnosis Date Noted  . Migraine, unspecified, without mention of intractable migraine without mention of status migrainosus 11/15/2012  . 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 Anxiety 05/26/2011   Past Medical History  Diagnosis Date  . History of nephrolithiasis 02/2011  . Abnormal Pap smear   . Anal sphincter incontinence after fourth degree tear    resolved as of 2012  . Panic attack     history of   Past Surgical History  Procedure Laterality Date  . Shoulder open rotator cuff repair  2004  . Rib cage  2006    rib cage bone removal  . Cesarean section    . Post partum sterilization    . Shoulder surgery    . Thoracic outlet surgery     History  Substance Use Topics  . Smoking status: Former Smoker    Quit date: 04/16/2008  . Smokeless tobacco: Never Used  . Alcohol Use: Yes     Comment: occasionally   Family History  Problem Relation Age of Onset  . Heart disease Father     MI in 36s   Allergies  Allergen Reactions  . Vicodin [Hydrocodone-Acetaminophen] Other (See Comments)    Nausea, dizziness, cold sweats   Current Outpatient Prescriptions  on File Prior to Visit  Medication Sig Dispense Refill  . Ascorbic Acid (VITAMIN C) 1000 MG tablet Take 1,000 mg by mouth daily.        . B Complex-C (B-COMPLEX WITH VITAMIN C) tablet Take 1 tablet by mouth daily.        Marland Kitchen ibuprofen (ADVIL,MOTRIN) 800 MG tablet Take 1 tablet (800 mg total) by mouth every 8 (eight) hours as needed for pain.  30 tablet  0  . Multiple Vitamin (MULTIVITAMIN) tablet Take 1 tablet by mouth daily.      . promethazine (PHENERGAN) 25 MG tablet Take 1 tablet (25 mg total) by mouth every 8 (eight) hours as needed for nausea (with migraine).  20 tablet  0  . tiZANidine (ZANAFLEX) 4 MG tablet Take 1 tablet (4 mg total) by mouth at bedtime as needed.  30 tablet  0  . topiramate (TOPAMAX) 25 MG tablet Take 1 tablet (25 mg total) by mouth daily.  30 tablet  1  . traMADol (ULTRAM) 50 MG tablet Take 100 mg by mouth 2 (two) times daily as needed for pain.      Marland Kitchen triamcinolone cream (KENALOG) 0.1 % Apply 1 application topically 2 (two) times daily.  30 g  0   No current facility-administered medications on  file prior to visit.      Review of Systems See HPI   Denies any new neuropathy, dizziness or fatigue.    Objective:   Physical Exam  BP 102/64  Pulse 66  Temp(Src) 98.1 F (36.7 C) (Oral)  Ht 5\' 6"  (1.676 m)  Wt 165 lb 8 oz (75.07 kg)  BMI 26.73 kg/m2  SpO2 99%  LMP 10/04/2013  Constitutional: She appears well-developed and well-nourished. No distress.  HENT:  Head: Normocephalic and atraumatic.  Right Ear: External ear normal.  Left Ear: External ear normal.  Nose: Nose normal.  Mouth/Throat: Oropharynx is clear and moist.  Eyes: Conjunctivae and EOM are normal. Pupils are equal, round, and reactive to light. No scleral icterus.  Neck: Normal range of motion. Neck supple. No JVD present. Carotid bruit is not present. No thyromegaly present.  Cardiovascular: Normal rate and regular rhythm.   Pulmonary/Chest: Effort normal and breath sounds normal.   Abdominal: She exhibits no abdominal bruit.  Musculoskeletal: She exhibits no edema.  Lymphadenopathy:    She has no cervical adenopathy.  Neurological: She is alert. She has normal strength and normal reflexes. She displays no atrophy and no tremor. No cranial nerve deficit or sensory deficit. She exhibits normal muscle tone. Coordination and gait normal.  Skin: Skin is warm and dry. No rash noted. No erythema. No pallor.  Psychiatric: She has a normal mood and affect.      Assessment & Plan:

## 2013-10-10 NOTE — Assessment & Plan Note (Signed)
Deteriorated but did have initial results with Topamax 25 mg daily. Will increase to 50 mg daily.  Did discuss increasing to 100 mg daily if she feels it is helping. The patient indicates understanding of these issues and agrees with the plan. She has appt with me in February and will follow up then.  Will call sooner if symptoms deteriorate or do not improve.

## 2013-10-15 ENCOUNTER — Other Ambulatory Visit: Payer: Self-pay | Admitting: Family Medicine

## 2013-10-15 ENCOUNTER — Telehealth: Payer: Self-pay | Admitting: Family Medicine

## 2013-10-15 DIAGNOSIS — G43909 Migraine, unspecified, not intractable, without status migrainosus: Secondary | ICD-10-CM

## 2013-10-15 MED ORDER — TRAMADOL HCL 50 MG PO TABS: 100.0000 mg | ORAL_TABLET | Freq: Two times a day (BID) | ORAL | Status: DC | PRN

## 2013-10-15 NOTE — Telephone Encounter (Signed)
Pt request Tramodol 50mg /prn for migranes that her previous neurologist prescribed her that she is not going to any more. She also request another referral for a new neurologist.

## 2013-10-15 NOTE — Telephone Encounter (Signed)
Ok to refill 

## 2013-10-15 NOTE — Telephone Encounter (Signed)
Neurology referral placed-- we did discuss this at last office visit and I agree she needs to see a new neurologist. Please call in rx for tramadol as entered.

## 2013-10-16 NOTE — Telephone Encounter (Signed)
Spoke to pt and informed her that Rx has been sent to requested. Pt states that she has been having tingling in her knees and thinks that it may be due to the Topamax. pls advise

## 2013-10-16 NOTE — Telephone Encounter (Signed)
Cut back to previous dose of topamax to see if that helps.  Keep us updated with symptoms.

## 2013-10-16 NOTE — Telephone Encounter (Signed)
Pt is aware and i advised to call back to update on any changes

## 2013-11-05 ENCOUNTER — Other Ambulatory Visit: Payer: 59

## 2013-11-08 ENCOUNTER — Encounter: Payer: 59 | Admitting: Family Medicine

## 2013-11-19 ENCOUNTER — Ambulatory Visit: Payer: 59 | Admitting: Neurology

## 2013-11-22 ENCOUNTER — Other Ambulatory Visit: Payer: Self-pay | Admitting: Family Medicine

## 2013-11-22 DIAGNOSIS — Z0000 Encounter for general adult medical examination without abnormal findings: Secondary | ICD-10-CM

## 2013-11-22 DIAGNOSIS — Z136 Encounter for screening for cardiovascular disorders: Secondary | ICD-10-CM

## 2013-11-26 ENCOUNTER — Other Ambulatory Visit (INDEPENDENT_AMBULATORY_CARE_PROVIDER_SITE_OTHER): Payer: 59

## 2013-11-26 DIAGNOSIS — Z0000 Encounter for general adult medical examination without abnormal findings: Secondary | ICD-10-CM

## 2013-11-26 DIAGNOSIS — Z136 Encounter for screening for cardiovascular disorders: Secondary | ICD-10-CM

## 2013-11-26 LAB — COMPREHENSIVE METABOLIC PANEL
ALBUMIN: 3.8 g/dL (ref 3.5–5.2)
ALT: 8 U/L (ref 0–35)
AST: 12 U/L (ref 0–37)
Alkaline Phosphatase: 59 U/L (ref 39–117)
BUN: 12 mg/dL (ref 6–23)
CALCIUM: 8.4 mg/dL (ref 8.4–10.5)
CHLORIDE: 110 meq/L (ref 96–112)
CO2: 23 mEq/L (ref 19–32)
CREATININE: 0.9 mg/dL (ref 0.4–1.2)
GFR: 77.54 mL/min (ref >60.00)
Glucose, Bld: 82 mg/dL (ref 70–99)
POTASSIUM: 3.8 meq/L (ref 3.5–5.1)
Sodium: 139 mEq/L (ref 135–145)
Total Bilirubin: 0.9 mg/dL (ref 0.3–1.2)
Total Protein: 6.8 g/dL (ref 6.0–8.3)

## 2013-11-26 LAB — CBC WITH DIFFERENTIAL/PLATELET
Basophils Absolute: 0 10*3/uL (ref 0.0–0.1)
Basophils Relative: 0.4 % (ref 0.0–3.0)
Eosinophils Absolute: 0.1 10*3/uL (ref 0.0–0.7)
Eosinophils Relative: 1.1 % (ref 0.0–5.0)
HCT: 38.8 % (ref 36.0–46.0)
Hemoglobin: 12.8 g/dL (ref 12.0–15.0)
LYMPHS PCT: 32.9 % (ref 12.0–46.0)
Lymphs Abs: 1.8 10*3/uL (ref 0.7–4.0)
MCHC: 33 g/dL (ref 30.0–36.0)
MCV: 92.5 fl (ref 78.0–100.0)
MONOS PCT: 7.1 % (ref 3.0–12.0)
Monocytes Absolute: 0.4 10*3/uL (ref 0.1–1.0)
Neutro Abs: 3.2 10*3/uL (ref 1.4–7.7)
Neutrophils Relative %: 58.5 % (ref 43.0–77.0)
PLATELETS: 180 10*3/uL (ref 150.0–400.0)
RBC: 4.2 Mil/uL (ref 3.87–5.11)
RDW: 13.6 % (ref 11.5–14.6)
WBC: 5.5 10*3/uL (ref 4.5–10.5)

## 2013-11-26 LAB — LIPID PANEL
CHOLESTEROL: 173 mg/dL (ref 0–200)
HDL: 58 mg/dL (ref >39.00)
LDL Cholesterol: 100 mg/dL — ABNORMAL HIGH (ref 0–99)
TRIGLYCERIDES: 73 mg/dL (ref 0.0–149.0)
Total CHOL/HDL Ratio: 3
VLDL: 14.6 mg/dL (ref 0.0–40.0)

## 2013-11-26 LAB — TSH: TSH: 0.39 u[IU]/mL (ref 0.35–5.50)

## 2013-11-29 ENCOUNTER — Encounter: Payer: 59 | Admitting: Family Medicine

## 2013-12-10 ENCOUNTER — Encounter: Payer: Self-pay | Admitting: Family Medicine

## 2013-12-10 ENCOUNTER — Ambulatory Visit (INDEPENDENT_AMBULATORY_CARE_PROVIDER_SITE_OTHER): Payer: 59 | Admitting: Family Medicine

## 2013-12-10 VITALS — BP 110/62 | HR 70 | Temp 98.1°F | Ht 65.5 in | Wt 166.2 lb

## 2013-12-10 DIAGNOSIS — F419 Anxiety disorder, unspecified: Secondary | ICD-10-CM

## 2013-12-10 DIAGNOSIS — H9193 Unspecified hearing loss, bilateral: Secondary | ICD-10-CM

## 2013-12-10 DIAGNOSIS — H9191 Unspecified hearing loss, right ear: Secondary | ICD-10-CM | POA: Insufficient documentation

## 2013-12-10 DIAGNOSIS — H9190 Unspecified hearing loss, unspecified ear: Secondary | ICD-10-CM

## 2013-12-10 DIAGNOSIS — Z0000 Encounter for general adult medical examination without abnormal findings: Secondary | ICD-10-CM | POA: Insufficient documentation

## 2013-12-10 DIAGNOSIS — G43909 Migraine, unspecified, not intractable, without status migrainosus: Secondary | ICD-10-CM

## 2013-12-10 DIAGNOSIS — F411 Generalized anxiety disorder: Secondary | ICD-10-CM

## 2013-12-10 MED ORDER — BACLOFEN 10 MG PO TABS: 10.0000 mg | ORAL_TABLET | Freq: Three times a day (TID) | ORAL | Status: DC

## 2013-12-10 NOTE — Progress Notes (Signed)
Subjective:    Patient ID: Danielle Schneider, female    DOB: [birth year], 33 y.o.   MRN: [id number]  HPI 33 yo pleasant female here for CPX.  Pap smear UTD- 09/21/13 (GYN).  Migraine headaches-difficult to control..    Has seen a neurologist in the past ( Dr. Pearlean Brownie).  Only saw him once.  He advised that she stop taking amitriptyline.  She felt it was not helping.  imitrex and maxalt do not help, in fact maxalt made her body "feel heavy."  Started topamax 25 mg daily in 06/2013.  Initially felt it was working- had more energy, migraines decreased to 1 every 2 weeks and would not last as long.  In 09/2013, I increased her topamax to 50 mg qhs.  Symptoms were not improving and she therefore asked to be referred to another neurologist.  Micah Flesher to see Dr. Neale Burly at [facility].  He stopped her topamax and flexeril.  Takes Baclofen 10 mg daily prn migraine.  Cannot remember what he called in for prophylaxis.  She has not picked this up from pharmacy yet.  He will be starting Trigger point injections.  HA still not controlled.  Decreased hearing, especially right sided.  Seems like she is almost hearing too much back ground noise.  Right eye is often blurry as well.  Lab Results  Component Value Date   CHOL 173 11/26/2013   HDL 58.00 11/26/2013   LDLCALC 100* 11/26/2013   TRIG 73.0 11/26/2013   CHOLHDL 3 11/26/2013    Lab Results  Component Value Date   WBC 5.5 11/26/2013   HGB 12.8 11/26/2013   HCT 38.8 11/26/2013   MCV 92.5 11/26/2013   PLT 180.0 11/26/2013   Lab Results  Component Value Date   CREATININE 0.9 11/26/2013     Patient Active Problem List   Diagnosis Date Noted  . Routine general medical examination at a health care facility 12/10/2013  . Migraine, unspecified, without mention of intractable migraine without mention of status migrainosus 11/15/2012  . Anxiety 05/26/2011   Past Medical History  Diagnosis Date  . History of nephrolithiasis 02/2011  . Abnormal  Pap smear   . Anal sphincter incontinence after fourth degree tear    resolved as of 2012  . Panic attack     history of   Past Surgical History  Procedure Laterality Date  . Shoulder open rotator cuff repair  2004  . Rib cage  2006    rib cage bone removal  . Cesarean section    . Post partum sterilization    . Shoulder surgery    . Thoracic outlet surgery     History  Substance Use Topics  . Smoking status: Former Smoker    Quit date: 04/16/2008  . Smokeless tobacco: Never Used  . Alcohol Use: Yes     Comment: occasionally   Family History  Problem Relation Age of Onset  . Heart disease Father     MI in 60s   Allergies  Allergen Reactions  . Vicodin [Hydrocodone-Acetaminophen] Other (See Comments)    Nausea, dizziness, cold sweats   Current Outpatient Prescriptions on File Prior to Visit  Medication Sig Dispense Refill  . Ascorbic Acid (VITAMIN C) 1000 MG tablet Take 1,000 mg by mouth daily.        . B Complex-C (B-COMPLEX WITH VITAMIN C) tablet Take 1 tablet by mouth daily.        . cyclobenzaprine (FLEXERIL) 10 MG tablet TAKE ONE  TABLET BY MOUTH TWICE DAILY AS NEEDED FOR MUSCLE SPASM *SEDATION PRECAUTIONS*  30 tablet  0  . ibuprofen (ADVIL,MOTRIN) 800 MG tablet Take 1 tablet (800 mg total) by mouth every 8 (eight) hours as needed for pain.  30 tablet  0  . Multiple Vitamin (MULTIVITAMIN) tablet Take 1 tablet by mouth daily.      . promethazine (PHENERGAN) 25 MG tablet Take 1 tablet (25 mg total) by mouth every 8 (eight) hours as needed for nausea (with migraine).  20 tablet  0  . tiZANidine (ZANAFLEX) 4 MG tablet Take 1 tablet (4 mg total) by mouth at bedtime as needed.  30 tablet  0  . topiramate (TOPAMAX) 50 MG tablet Take 1 tablet (50 mg total) by mouth daily.  30 tablet  3  . traMADol (ULTRAM) 50 MG tablet Take 2 tablets (100 mg total) by mouth 2 (two) times daily as needed.  30 tablet  0  . triamcinolone cream (KENALOG) 0.1 % Apply 1 application topically 2 (two)  times daily.  30 g  0   No current facility-administered medications on file prior to visit.      Review of Systems See HPI   Denies any new neuropathy, dizziness or fatigue. + right sided blurred vision- has eye doctor appt next week    Objective:   Physical Exam  BP 110/62  Pulse 70  Temp(Src) 98.1 F (36.7 C) (Oral)  Ht 5' 5.5" (1.664 m)  Wt 166 lb 4 oz (75.411 kg)  BMI 27.24 kg/m2  SpO2 98%  LMP 12/02/2013  Wt Readings from Last 3 Encounters:  12/10/13 166 lb 4 oz (75.411 kg)  10/10/13 165 lb 8 oz (75.07 kg)  09/21/13 169 lb (76.658 kg)     Constitutional: She appears well-developed and well-nourished. No distress.  HENT:  Head: Normocephalic and atraumatic.  Right Ear: External ear normal.  Left Ear: External ear normal.  Nose: Nose normal.  Mouth/Throat: Oropharynx is clear and moist.  Eyes: Conjunctivae and EOM are normal. Pupils are equal, round, and reactive to light. No scleral icterus.  Neck: Normal range of motion. Neck supple. No JVD present. Carotid bruit is not present. No thyromegaly present.  Cardiovascular: Normal rate and regular rhythm.   Pulmonary/Chest: Effort normal and breath sounds normal.  Abdominal: She exhibits no abdominal bruit.  Musculoskeletal: She exhibits no edema.  Lymphadenopathy:    She has no cervical adenopathy.  Neurological: She is alert. She has normal strength and normal reflexes. She displays no atrophy and no tremor. No cranial nerve deficit or sensory deficit. She exhibits normal muscle tone. Coordination and gait normal.  Skin: Skin is warm and dry. No rash noted. No erythema. No pallor.  Psychiatric: She has a normal mood and affect.      Assessment & Plan:

## 2013-12-10 NOTE — Assessment & Plan Note (Signed)
Deteriorated.  Followed by Dr. Neale BurlyFreeman.

## 2013-12-10 NOTE — Patient Instructions (Signed)
Good to see you. Please let me know if you do want to be referred to an audiologist.  Please update me with your migraines.

## 2013-12-10 NOTE — Assessment & Plan Note (Signed)
?   Phonophobia- increased sensitivity to back ground noise due to migraines.  Offered audiology referral but she would like to wait to see if this will improve once her migraines improve.

## 2013-12-10 NOTE — Assessment & Plan Note (Signed)
Reviewed preventive care protocols, scheduled due services, and updated immunizations Discussed nutrition, exercise, diet, and healthy lifestyle.  

## 2013-12-10 NOTE — Progress Notes (Signed)
Pre visit review using our clinic review tool, if applicable. No additional management support is needed unless otherwise documented below in the visit note. 

## 2013-12-24 ENCOUNTER — Telehealth: Payer: Self-pay | Admitting: Family Medicine

## 2013-12-24 NOTE — Telephone Encounter (Signed)
Placed in Dr Aron's inbox for completion 

## 2013-12-24 NOTE — Telephone Encounter (Signed)
Pt dropped off FMLA forms to be completed for intermittent FMLA.  I have completed these forms w/the exception on one section which is marked w/a yellow flag.  Dr. Dayton MartesAron will need to review/complete this section.  Once the forms have been reviewed and signed by Dr. Dayton MartesAron you can return to me.  I have placed the forms in an orange and put it on your desk to pass on to Dr. Dayton MartesAron.  Thank you.

## 2013-12-25 ENCOUNTER — Telehealth: Payer: Self-pay | Admitting: Family Medicine

## 2013-12-25 DIAGNOSIS — Z0279 Encounter for issue of other medical certificate: Secondary | ICD-10-CM

## 2013-12-25 NOTE — Telephone Encounter (Signed)
Left mssg on pt's cell phone to c/b in reference to her FMLA forms.

## 2013-12-26 NOTE — Telephone Encounter (Signed)
Left mssg on pt's cell # that completed FMLA forms are at front, ready to be picked up.

## 2014-01-22 ENCOUNTER — Encounter: Payer: Self-pay | Admitting: Family Medicine

## 2014-01-22 ENCOUNTER — Encounter (INDEPENDENT_AMBULATORY_CARE_PROVIDER_SITE_OTHER): Payer: Self-pay

## 2014-01-22 ENCOUNTER — Telehealth: Payer: Self-pay | Admitting: Radiology

## 2014-01-22 ENCOUNTER — Ambulatory Visit (INDEPENDENT_AMBULATORY_CARE_PROVIDER_SITE_OTHER): Payer: 59 | Admitting: Family Medicine

## 2014-01-22 ENCOUNTER — Ambulatory Visit (INDEPENDENT_AMBULATORY_CARE_PROVIDER_SITE_OTHER)
Admission: RE | Admit: 2014-01-22 | Discharge: 2014-01-22 | Disposition: A | Discharge status: Home / Self Care | Payer: 59 | Source: Ambulatory Visit | Attending: Family Medicine | Admitting: Family Medicine

## 2014-01-22 VITALS — BP 116/74 | HR 64 | Temp 98.1°F | Wt 158.5 lb

## 2014-01-22 DIAGNOSIS — R079 Chest pain, unspecified: Secondary | ICD-10-CM

## 2014-01-22 LAB — CBC
HCT: 37.3 % (ref 36.0–46.0)
Hemoglobin: 12.6 g/dL (ref 12.0–15.0)
MCHC: 33.9 g/dL (ref 30.0–36.0)
MCV: 91.6 fl (ref 78.0–100.0)
Platelets: 174 10*3/uL (ref 150.0–400.0)
RBC: 4.08 Mil/uL (ref 3.87–5.11)
RDW: 13.4 % (ref 11.5–14.6)
WBC: 7.1 10*3/uL (ref 4.5–10.5)

## 2014-01-22 LAB — COMPREHENSIVE METABOLIC PANEL
ALBUMIN: 4.3 g/dL (ref 3.5–5.2)
ALT: 12 U/L (ref 0–35)
AST: 16 U/L (ref 0–37)
Alkaline Phosphatase: 54 U/L (ref 39–117)
BUN: 15 mg/dL (ref 6–23)
CALCIUM: 9.1 mg/dL (ref 8.4–10.5)
CHLORIDE: 105 meq/L (ref 96–112)
CO2: 27 mEq/L (ref 19–32)
Creatinine, Ser: 0.8 mg/dL (ref 0.4–1.2)
GFR: 91.56 mL/min (ref >60.00)
Glucose, Bld: 83 mg/dL (ref 70–99)
POTASSIUM: 3.7 meq/L (ref 3.5–5.1)
Sodium: 137 mEq/L (ref 135–145)
Total Bilirubin: 0.9 mg/dL (ref 0.3–1.2)
Total Protein: 7 g/dL (ref 6.0–8.3)

## 2014-01-22 LAB — TSH: TSH: 0.45 u[IU]/mL (ref 0.35–5.50)

## 2014-01-22 LAB — VITAMIN B12: Vitamin B-12: 405 pg/mL (ref 211–911)

## 2014-01-22 LAB — T4, FREE: FREE T4: 0.75 ng/dL (ref 0.60–1.60)

## 2014-01-22 LAB — D-DIMER, QUANTITATIVE: D-Dimer, Quant: 0.27 ug/mL-FEU (ref 0.00–0.48)

## 2014-01-22 NOTE — Telephone Encounter (Signed)
Stat lab, D-Dimer, 0.27.

## 2014-01-22 NOTE — Patient Instructions (Signed)
Great to see you. I will call you with your chest xray and labs results. Please go to ER if symptoms worsen.

## 2014-01-22 NOTE — Progress Notes (Signed)
Pre visit review using our clinic review tool, if applicable. No additional management support is needed unless otherwise documented below in the visit note. 

## 2014-01-22 NOTE — Progress Notes (Signed)
Subjective:   Patient ID: Danielle Schneider, female    DOB: 03/16/1981, 33 y.o.   MRN: [id number]  Danielle Schneider is a pleasant 33 y.o. 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  Non exertional.  Continued intermittently all night chest feels tight. Not taking any hormonal contraceptives, no recent travel.  No h/o DVTs.  Non smoker.  Nothing seems to make it better or worse.  She is on a very strict Paleo diet for her migraines.  She wonders if this is causing these symptoms.  She also has been bradycardic when this happens.  No palpitations.  No LE edema.  Patient Active Problem List   Diagnosis Date Noted  . Routine general medical examination at a health care facility 12/10/2013  . 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 Migraine, unspecified, without mention of intractable migraine without mention of status migrainosus 11/15/2012  . Anxiety 05/26/2011   Past Medical History  Diagnosis Date  . History of nephrolithiasis 02/2011  . Abnormal Pap smear   . Anal sphincter incontinence after fourth degree tear    resolved as of 2012  . Panic attack     history of   Past Surgical History  Procedure Laterality Date  . Shoulder open rotator cuff repair  2004  . Rib cage  2006    rib cage bone removal  . Cesarean section    . Post partum sterilization    . Shoulder surgery    . Thoracic outlet surgery     History  Substance Use Topics  . Smoking status: Former Smoker    Quit date: 04/16/2008  . Smokeless tobacco: Never Used  . Alcohol Use: Yes     Comment: occasionally   Family History  Problem Relation Age of Onset  . Heart disease Father     MI in 7540s   Allergies  Allergen Reactions  . Vicodin [Hydrocodone-Acetaminophen] Other (See Comments)    Nausea, dizziness, cold sweats   Current Outpatient Prescriptions on File Prior to Visit  Medication Sig  Dispense Refill  . Ascorbic Acid (VITAMIN C) 1000 MG tablet Take 1,000 mg by mouth daily.        . B Complex-C (B-COMPLEX WITH VITAMIN C) tablet Take 1 tablet by mouth daily.        . baclofen (LIORESAL) 10 MG tablet Take 1 tablet (10 mg total) by mouth 3 (three) times daily.  30 each  0  . ibuprofen (ADVIL,MOTRIN) 800 MG tablet Take 1 tablet (800 mg total) by mouth every 8 (eight) hours as needed for pain.  30 tablet  0  . Multiple Vitamin (MULTIVITAMIN) tablet Take 1 tablet by mouth daily.      . promethazine (PHENERGAN) 25 MG tablet Take 1 tablet (25 mg total) by mouth every 8 (eight) hours as needed for nausea (with migraine).  20 tablet  0  . traMADol (ULTRAM) 50 MG tablet Take 2 tablets (100 mg total) by mouth 2 (two) times daily as needed.  30 tablet  0   No current facility-administered medications on file prior to visit.   The PMH, PSH, Social History, Family History, Medications, and [facility], and have been updated if relevant.   Review of Systems See HPI    Objective:    BP 116/74  Pulse 64  Temp(Src) 98.1  F (36.7 C) (Oral)  Wt 158 lb 8 oz (71.895 kg)  SpO2 98%  LMP 12/30/2013  Wt Readings from Last 3 Encounters:  01/22/14 158 lb 8 oz (71.895 kg)  12/10/13 166 lb 4 oz (75.411 kg)  10/10/13 165 lb 8 oz (75.07 kg)    Physical Exam  Nursing note and vitals reviewed. Constitutional: She appears well-developed and well-nourished. No distress.  HENT:  Head: Normocephalic.  Cardiovascular: Normal rate and regular rhythm.   No murmur heard. Pulmonary/Chest: Effort normal and breath sounds normal. She exhibits no tenderness.  Abdominal: Soft. Bowel sounds are normal.  Musculoskeletal: Normal range of motion. She exhibits no edema.  Skin: Skin is warm and dry.  Psychiatric: She has a normal mood and affect. Her behavior is normal. Judgment and thought content normal.          Assessment & Plan:   Chest pain - Plan: EKG 12-Lead No  Follow-up on file.

## 2014-01-22 NOTE — Telephone Encounter (Signed)
[facility] called a critical D Dimer - 2.7

## 2014-01-22 NOTE — Telephone Encounter (Signed)
Wrong result of 2.7 for a D Dimer was entered in error

## 2014-01-22 NOTE — Assessment & Plan Note (Signed)
EKG normal other than bradycardia. Will check stat labs and cxr.  Exam benign today.   Orders Placed This Encounter  Procedures  . DG Chest 2 View  . D-dimer, quantitative  . TSH  . T4, free  . CBC  . Comprehensive metabolic panel  . Vitamin B12  . EKG 12-Lead   She is aware to go to ER if symptoms worsen.

## 2014-05-07 ENCOUNTER — Encounter: Payer: Self-pay | Admitting: Family Medicine

## 2014-05-17 ENCOUNTER — Encounter: Payer: Self-pay | Admitting: Family Medicine

## 2014-05-17 ENCOUNTER — Ambulatory Visit (INDEPENDENT_AMBULATORY_CARE_PROVIDER_SITE_OTHER): Payer: 59 | Admitting: Family Medicine

## 2014-05-17 ENCOUNTER — Telehealth: Payer: Self-pay | Admitting: Family Medicine

## 2014-05-17 VITALS — BP 80/52 | HR 72 | Temp 98.0°F | Wt 147.2 lb

## 2014-05-17 DIAGNOSIS — I959 Hypotension, unspecified: Secondary | ICD-10-CM | POA: Insufficient documentation

## 2014-05-17 DIAGNOSIS — G43909 Migraine, unspecified, not intractable, without status migrainosus: Secondary | ICD-10-CM

## 2014-05-17 LAB — COMPREHENSIVE METABOLIC PANEL
ALT: 11 U/L (ref 0–35)
AST: 13 U/L (ref 0–37)
Albumin: 3.8 g/dL (ref 3.5–5.2)
Alkaline Phosphatase: 56 U/L (ref 39–117)
BUN: 11 mg/dL (ref 6–23)
CALCIUM: 8.6 mg/dL (ref 8.4–10.5)
CHLORIDE: 106 meq/L (ref 96–112)
CO2: 26 meq/L (ref 19–32)
Creatinine, Ser: 0.9 mg/dL (ref 0.4–1.2)
GFR: 75.37 mL/min (ref >60.00)
Glucose, Bld: 81 mg/dL (ref 70–99)
Potassium: 4.4 mEq/L (ref 3.5–5.1)
Sodium: 137 mEq/L (ref 135–145)
Total Bilirubin: 0.5 mg/dL (ref 0.2–1.2)
Total Protein: 6.5 g/dL (ref 6.0–8.3)

## 2014-05-17 LAB — CBC WITH DIFFERENTIAL/PLATELET
Basophils Absolute: 0 10*3/uL (ref 0.0–0.1)
Basophils Relative: 0.5 % (ref 0.0–3.0)
Eosinophils Absolute: 0 10*3/uL (ref 0.0–0.7)
Eosinophils Relative: 0.9 % (ref 0.0–5.0)
HEMATOCRIT: 38.2 % (ref 36.0–46.0)
HEMOGLOBIN: 12.7 g/dL (ref 12.0–15.0)
LYMPHS PCT: 31.8 % (ref 12.0–46.0)
Lymphs Abs: 1.5 10*3/uL (ref 0.7–4.0)
MCHC: 33.4 g/dL (ref 30.0–36.0)
MCV: 92.2 fl (ref 78.0–100.0)
MONOS PCT: 12.1 % — AB (ref 3.0–12.0)
Monocytes Absolute: 0.6 10*3/uL (ref 0.1–1.0)
NEUTROS ABS: 2.5 10*3/uL (ref 1.4–7.7)
Neutrophils Relative %: 54.7 % (ref 43.0–77.0)
Platelets: 151 10*3/uL (ref 150.0–400.0)
RBC: 4.14 Mil/uL (ref 3.87–5.11)
RDW: 13.7 % (ref 11.5–15.5)
WBC: 4.6 10*3/uL (ref 4.0–10.5)

## 2014-05-17 LAB — TSH: TSH: 0.82 u[IU]/mL (ref 0.35–4.50)

## 2014-05-17 LAB — CORTISOL: Cortisol, Plasma: 9 ug/dL

## 2014-05-17 LAB — T4, FREE: FREE T4: 0.73 ng/dL (ref 0.60–1.60)

## 2014-05-17 LAB — HEMOGLOBIN A1C: HEMOGLOBIN A1C: 4.8 % (ref 4.6–6.5)

## 2014-05-17 NOTE — Progress Notes (Signed)
Pre visit review using our clinic review tool, if applicable. No additional management support is needed unless otherwise documented below in the visit note. 

## 2014-05-17 NOTE — Progress Notes (Signed)
Subjective:   Patient ID: Danielle Schneider, female    DOB: 07/30/1981, 33 y.o.   MRN: [id number]  Danielle Bentmanda M Chaves is a pleasant 33 y.o. year old female who presents to clinic today with Hypotension  on 05/17/2014  HPI: Hypotension- She is an Charity fundraiserN at [employer].  Has always had "low blood pressure," but never symptomatic. Now BPs have been running as low as 70s/30s past 3 weeks.  She has been dizzy when standing from seated position.  Migraine headaches-have difficult to control but finally improved.   [facility]eman [facility].  She is taking Zonegran and as needed baclofen. Last took Baclofen last weekend- did not notice decreased BP.  Has been losing weight which she attributes to improvements she has made in her diet(migraine diet).  Does feel better when she eats a higher salt diet.  Wt Readings from Last 3 Encounters:  05/17/14 147 lb 4 oz (66.792 kg)  01/22/14 158 lb 8 oz (71.895 kg)  12/10/13 166 lb 4 oz (75.411 kg)   Lab Results  Component Value Date   TSH 0.45 01/22/2014   Lab Results  Component Value Date   WBC 7.1 01/22/2014   HGB 12.6 01/22/2014   HCT 37.3 01/22/2014   MCV 91.6 01/22/2014   PLT 174.0 01/22/2014   No current outpatient prescriptions on file prior to visit.   No current facility-administered medications on file prior to visit.    Allergies  Allergen Reactions  . Vicodin [Hydrocodone-Acetaminophen] Other (See Comments)    Nausea, dizziness, cold sweats    Past Medical History  Diagnosis Date  . History of nephrolithiasis 02/2011  . Abnormal Pap smear   . Anal sphincter incontinence after fourth degree tear    resolved as of 2012  . Panic attack     history of    Past Surgical History  Procedure Laterality Date  . Shoulder open rotator cuff repair  2004  . Rib cage  2006    rib cage bone removal  . Cesarean section    . Post partum sterilization    . Shoulder surgery    . Thoracic outlet surgery      Family History    Problem Relation Age of Onset  . Heart disease Father     MI in 7640s    History   Social History  . Marital Status: Married    Spouse Name: N/A    Number of Children: N/A  . Years of Education: N/A   Occupational History  . Not on file.   Social History Main Topics  . Smoking status: Former Smoker    Quit date: 04/16/2008  . Smokeless tobacco: Never Used  . Alcohol Use: Yes     Comment: occasionally  . Drug Use: No  . Sexual Activity: Yes    Partners: Male    Birth Control/ Protection: Surgical   Other Topics Concern  . Not on file   Social History Narrative  . No narrative on file   The PMH, PSH, Social History, Family History, Medications, and allergies have been reviewed in [facility], and have been updated if relevant.   Review of Systems See HPI Intermittent CP- has had a neg cardiac w/u including Holter monitor Denies anxiety or depression No blurred vision No LE weakness    Objective:    BP 80/52  Pulse 72  Temp(Src) 98 F (36.7 C) (Oral)  Wt 147 lb 4 oz (66.792 kg)  SpO2 98%  LMP 05/02/2014   Physical Exam  Nursing note and vitals reviewed. Constitutional: She is oriented to person, place, and time. She appears well-developed and well-nourished. No distress.  HENT:  Head: Normocephalic.  Eyes: Pupils are equal, round, and reactive to light.  Musculoskeletal: Normal range of motion.  Neurological: She is alert and oriented to person, place, and time. She has normal reflexes. No cranial nerve deficit. Coordination normal.  Skin: Skin is warm and dry. No rash noted. No erythema. No pallor.  Psychiatric: She has a normal mood and affect. Her behavior is normal. Judgment and thought content normal.          Assessment & Plan:   Hypotension, unspecified  Migraine, unspecified, without mention of intractable migraine without mention of status migrainosus No Follow-up on file.

## 2014-05-17 NOTE — Patient Instructions (Signed)
Good to see you. Please increase your sodium intake.  I will call you with your lab results.

## 2014-05-17 NOTE — Assessment & Plan Note (Signed)
New- progressive. ?due to baclofen but she is not taking frequently. ? Adrenal issue vs POTs. Will check labs today. Advised to eat higher salt diet. May need to consider nerve condition studies/tilt table- rule out POTS. ?alpha blocker.

## 2014-05-17 NOTE — Telephone Encounter (Signed)
[facility]

## 2014-05-17 NOTE — Telephone Encounter (Signed)
FMLA form faxed to office from [employer]. Pt has 2 pm appt today. The forms are filled out and in you in box for signature. [facility] to [facility] once signed.

## 2014-05-17 NOTE — Assessment & Plan Note (Signed)
Improved but remains severe when she has them. FMLA filled out and returned to pt today for her migraines.

## 2014-05-20 ENCOUNTER — Encounter: Payer: Self-pay | Admitting: Family Medicine

## 2014-05-20 NOTE — Telephone Encounter (Signed)
[employer] faxed back FMLA forms with a few questions. I have marked on the forms where it needs to be reviewed. Forms in your inbox.

## 2014-05-21 NOTE — Telephone Encounter (Signed)
Completed and on my desk.

## 2014-05-21 NOTE — Telephone Encounter (Signed)
Updated forms faxed to [employer]- Attn: Regenia SkeeterJennifer Grullon at [phone].

## 2014-05-23 ENCOUNTER — Ambulatory Visit: Payer: 59 | Admitting: Family Medicine

## 2014-07-08 ENCOUNTER — Telehealth: Payer: Self-pay | Admitting: *Deleted

## 2014-07-08 MED ORDER — ZONISAMIDE 100 MG PO CAPS: 100.0000 mg | ORAL_CAPSULE | Freq: Every day | ORAL | Status: DC

## 2014-07-08 NOTE — Telephone Encounter (Signed)
eRx sent to [facility].

## 2014-07-08 NOTE — Telephone Encounter (Signed)
Pt calls to see if you could refill her zonegran. She says she can't afford to return see Dr specialist right now, but needs med to prevent migraines.

## 2014-08-12 ENCOUNTER — Encounter: Payer: Self-pay | Admitting: Family Medicine

## 2014-11-06 ENCOUNTER — Other Ambulatory Visit: Payer: Self-pay | Admitting: *Deleted

## 2014-11-06 MED ORDER — ZONISAMIDE 100 MG PO CAPS: 100.0000 mg | ORAL_CAPSULE | Freq: Every day | ORAL | Status: DC

## 2015-01-07 ENCOUNTER — Encounter: Payer: Self-pay | Admitting: Family Medicine

## 2015-01-23 ENCOUNTER — Telehealth: Payer: Self-pay | Admitting: Family Medicine

## 2015-01-23 NOTE — Telephone Encounter (Signed)
LVM on pt home phone to c/b about migraine/neuro referral. Pt cell phone was not accepting calls

## 2015-03-24 ENCOUNTER — Other Ambulatory Visit: Payer: Self-pay | Admitting: *Deleted

## 2015-03-24 MED ORDER — ZONISAMIDE 100 MG PO CAPS: 100.0000 mg | ORAL_CAPSULE | Freq: Every day | ORAL | Status: DC

## 2015-04-30 ENCOUNTER — Other Ambulatory Visit: Payer: Self-pay

## 2015-04-30 MED ORDER — ZONISAMIDE 100 MG PO CAPS: 100.0000 mg | ORAL_CAPSULE | Freq: Every day | ORAL | Status: DC

## 2015-04-30 NOTE — Telephone Encounter (Signed)
Pt left v/m requesting refill zonegran to [facility] [address]. If pt misses one day of med will get migraine; pt last seen 05/17/2014 but has appt scheduled to see Dr Dayton MartesAron on 05/07/15.rx last refilled # 30 on 03/24/2015. Pt request cb.

## 2015-05-07 ENCOUNTER — Encounter: Payer: Self-pay | Admitting: Family Medicine

## 2015-05-07 ENCOUNTER — Ambulatory Visit (INDEPENDENT_AMBULATORY_CARE_PROVIDER_SITE_OTHER): Payer: 59 | Admitting: Family Medicine

## 2015-05-07 VITALS — BP 110/68 | HR 84 | Temp 98.3°F | Wt 152.5 lb

## 2015-05-07 DIAGNOSIS — I959 Hypotension, unspecified: Secondary | ICD-10-CM

## 2015-05-07 DIAGNOSIS — G43709 Chronic migraine without aura, not intractable, without status migrainosus: Secondary | ICD-10-CM | POA: Diagnosis not present

## 2015-05-07 NOTE — Assessment & Plan Note (Signed)
Persistent, intermittent issue. Refer to cardiology. Not classic for POTs but would like her to be evaluated.

## 2015-05-07 NOTE — Progress Notes (Signed)
Pre visit review using our clinic review tool, if applicable. No additional management support is needed unless otherwise documented below in the visit note. 

## 2015-05-07 NOTE — Patient Instructions (Signed)
Postural Orthostatic Tachycardia Syndrome Postural orthostatic tachycardia syndrome (POTS) is an increased heart rate when going from a lying (supine) position to a standing position. The heart rate may increase more than 30 beats per minute (BPM) above its resting rate when going from a lying to a standing position. POTS occurs more frequently in women than in men.  SYMPTOMS  POTS symptoms may be increased in the morning. Symptoms of POTS include:  Fainting or near fainting.  Inability to think clearly.  Extreme or chronic fatigue.  Exercise intolerance.  Chest pain.  Having the lower legs develop a reddish-blue color due to decreased blood flow (acrocyanosis). CAUSES POTS can be caused by different conditions. Sometimes, it has no known cause (idiopathic). Some causes of POTS include:  Viral illness.  Pregnancy.  Autoimmune diseases.  Medications.  Major surgery.  Trauma such as a car accident or major injury.  Medical conditions such as anemia, dehydration, and hyperthyroidism. DIAGNOSIS  POTS is diagnosed by:  Taking a complete history and physical exam.  Measuring the heart rate while lying and then upon standing.  Measuring blood pressure when going from a lying to a standing position. POTS is usually not associated with low blood pressure (orthostatic hypotension) when going from a lying to standing position. While standing, blood pressure should be taken 2, 5, and 10 minutes after getting up. TREATMENT  Treatment of POTS depends upon the severity of the symptoms. Treatment includes:  Drinking plenty of fluids to avoid getting dehydrated.  Avoiding very hot environments to not get overheated.  Increasing your dietary salt intake as instructed by your caregiver.  Taking different types of medications as prescribed for POTS.  Avoiding some classes of medications such as vasodilators and diuretics. SEEK IMMEDIATE MEDICAL CARE IF  You have severe chest pain  that does not go away. Call your local emergency service immediately.  You feel your heart racing or beating rapidly.  You feel like passing out.  You have very confused thinking. MAKE SURE YOU  Understand these instructions.  Will watch your condition.  Will get help right away if you are not doing well or get worse. Document Released: 09/17/2002 Document Revised: 02/11/2014 Document Reviewed: 11/25/2010 [employer] Patient Information 2015 [employer]. This information is not intended to replace advice given to you by your health care provider. Make sure you discuss any questions you have with your health care provider.  

## 2015-05-07 NOTE — Assessment & Plan Note (Signed)
Well controlled on current rx. No changes made today. 

## 2015-05-07 NOTE — Progress Notes (Signed)
Subjective:   Patient ID: Danielle Schneider, female    DOB: [birth year], 34 y.o.   MRN: [id number]  Danielle Schneider is a pleasant 34 y.o. 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 Taking Zonegran only. Last migraine was weeks ago.  She still is having issues with hypotension and presycope.  Does not feel like heart is racing- actually feels like her heart rate is slower than it should be when she has these episodes.  Does seem positional as well.  Current Outpatient Prescriptions on File Prior to Visit  Medication Sig Dispense Refill  . zonisamide (ZONEGRAN) 100 MG capsule Take 1 capsule (100 mg total) by mouth daily. 30 capsule 0   No current facility-administered medications on file prior to visit.    Allergies  Allergen Reactions  . Vicodin [Hydrocodone-Acetaminophen] Other (See Comments)    Nausea, dizziness, cold sweats    Past Medical History  Diagnosis Date  . History of nephrolithiasis 02/2011  . Abnormal Pap smear   . Anal sphincter incontinence after fourth degree tear    resolved as of 2012  . Panic attack     history of    Past Surgical History  Procedure Laterality Date  . Shoulder open rotator cuff repair  2004  . Rib cage  2006    rib cage bone removal  . Cesarean section    . Post partum sterilization    . Shoulder surgery    . Thoracic outlet surgery      Family History  Problem Relation Age of Onset  . Heart disease Father     MI in 51s    History   Social History  . Marital Status: Married    Spouse Name: N/A  . Number of Children: N/A  . Years of Education: N/A   Occupational History  . Not on file.   Social History Main Topics  . Smoking status: Former Smoker    Quit date: 04/16/2008  . Smokeless tobacco: Never Used  . Alcohol Use: Yes     Comment: occasionally  . Drug Use: No  . Sexual Activity:   Partners: Male    Birth Control/ Protection: Surgical   Other Topics Concern  . Not on file   Social History Narrative   The PMH, PSH, Social History, Family History, Medications, and allergies have been reviewed in [facility], and have been updated if relevant.  Review of Systems  Constitutional: Negative.   Respiratory: Negative.   Cardiovascular: Negative.   Gastrointestinal: Negative.   Musculoskeletal: Negative.   Skin: Negative.   Allergic/Immunologic: Negative.   Neurological: Positive for light-headedness. Negative for tremors, syncope, weakness and headaches.  Hematological: Negative.   Psychiatric/Behavioral: Negative.   All other systems reviewed and are negative.      Objective:    BP 110/68 mmHg  Pulse 84  Temp(Src) 98.3 F (36.8 C) (Oral)  Wt 152 lb 8 oz (69.174 kg)  SpO2 97%  LMP 04/30/2015 Wt Readings from Last 3 Encounters:  05/07/15 152 lb 8 oz (69.174 kg)  05/17/14 147 lb 4 oz (66.792 kg)  01/22/14 158 lb 8 oz (71.895 kg)    Physical Exam  Constitutional: She is oriented to person, place, and time. She appears well-developed and well-nourished. No distress.  HENT:  Head: Normocephalic.  Eyes: Conjunctivae are normal.  Cardiovascular: Normal rate and regular rhythm.  Pulmonary/Chest: Effort normal and breath sounds normal.  Musculoskeletal: She exhibits no edema.  Neurological: She is alert and oriented to person, place, and time. No cranial nerve deficit.  Skin: Skin is warm and dry.  Psychiatric: She has a normal mood and affect. Her behavior is normal. Thought content normal.  Nursing note and vitals reviewed.         Assessment & Plan:   Chronic migraine without aura without status migrainosus, not intractable No Follow-up on file.

## 2015-05-22 ENCOUNTER — Other Ambulatory Visit: Payer: Self-pay | Admitting: Family Medicine

## 2015-06-27 ENCOUNTER — Ambulatory Visit (INDEPENDENT_AMBULATORY_CARE_PROVIDER_SITE_OTHER): Payer: 59 | Admitting: Cardiovascular Disease

## 2015-06-27 ENCOUNTER — Ambulatory Visit: Payer: 59

## 2015-06-27 ENCOUNTER — Encounter: Payer: Self-pay | Admitting: Cardiovascular Disease

## 2015-06-27 ENCOUNTER — Encounter (INDEPENDENT_AMBULATORY_CARE_PROVIDER_SITE_OTHER): Payer: Self-pay

## 2015-06-27 VITALS — BP 100/58 | HR 71 | Ht 66.0 in | Wt 154.0 lb

## 2015-06-27 DIAGNOSIS — R42 Dizziness and giddiness: Secondary | ICD-10-CM

## 2015-06-27 DIAGNOSIS — R001 Bradycardia, unspecified: Secondary | ICD-10-CM | POA: Insufficient documentation

## 2015-06-27 DIAGNOSIS — G43709 Chronic migraine without aura, not intractable, without status migrainosus: Secondary | ICD-10-CM

## 2015-06-27 DIAGNOSIS — R0602 Shortness of breath: Secondary | ICD-10-CM

## 2015-06-27 DIAGNOSIS — I951 Orthostatic hypotension: Secondary | ICD-10-CM | POA: Diagnosis not present

## 2015-06-27 MED ORDER — FLUDROCORTISONE ACETATE 0.1 MG PO TABS: 0.1000 mg | ORAL_TABLET | Freq: Every day | ORAL | Status: DC

## 2015-06-27 NOTE — Assessment & Plan Note (Signed)
She reports having prolonged migraines. Taking Zonegran when necessary

## 2015-06-27 NOTE — Progress Notes (Signed)
Patient ID: Danielle Schneider, female    DOB: [birth year], 34 y.o.   MRN: [id number]  HPI Comments: Danielle Schneider is a pleasant 34 year old woman with long history of migraines, presenting for evaluation of hypotension and bradycardia.  She reports a long history of low blood pressure. We'll recently she has noticed episodes of lightheadedness. Typically episodes occur while she is in a standing position, sometimes in the kitchen while cooking, recently while standing shopping. Symptoms present gradually, sometimes get worse, will resolve if she sits down, lays down. She reports having 1-2 episodes per day.  Denies having any trigger, reports drinking significant fluids. She has been measuring her blood pressure at home and reports many systolic pressures in the 90s, rare in the 80s. Heart rate per anomaly in the 50s, rarely in the high 40s.  Otherwise she is active at baseline with no complaints. No leg edema, no PND or orthopnea. After each she has the episodes above, she does feel very tired for a prolonged period of time.  Orthostatics done in the office today with systolic pressure ranging from 105 up to 117 systolic, no significant change going from supine to standing position  EKG on today's visit shows normal sinus rhythm with rate 71 bpm, no significant ST or T-wave changes  Other past medical history She reports long protracted migraines despite taking medications   Allergies  Allergen Reactions  . Vicodin [Hydrocodone-Acetaminophen] Other (See Comments)    Nausea, dizziness, cold sweats    Current Outpatient Prescriptions on File Prior to Visit  Medication Sig Dispense Refill  . zonisamide (ZONEGRAN) 100 MG capsule Take 1 capsule (100 mg total) by mouth daily. 30 capsule 5   No current facility-administered medications on file prior to visit.    Past Medical History  Diagnosis Date  . History of nephrolithiasis 02/2011  . Abnormal Pap smear   . Anal sphincter  incontinence after fourth degree tear    resolved as of 2012  . Panic attack     history of    Past Surgical History  Procedure Laterality Date  . Shoulder open rotator cuff repair  2004  . Rib cage  2006    rib cage bone removal  . Cesarean section    . Post partum sterilization    . Shoulder surgery    . Thoracic outlet surgery      Social History  reports that she quit smoking about 7 years ago. She has never used smokeless tobacco. She reports that she drinks alcohol. She reports that she does not use illicit drugs.  Family History family history includes Heart disease in her father.   Review of Systems  Constitutional: Negative.   Respiratory: Negative.   Cardiovascular: Negative.   Gastrointestinal: Negative.   Musculoskeletal: Negative.   Neurological: Negative.   Hematological: Negative.   Psychiatric/Behavioral: Negative.   All other systems reviewed and are negative.   BP 100/58 mmHg  Pulse 71  Ht  (1.676 m)  Wt 154 lb (69.854 kg)  BMI 24.87 kg/m2  Physical Exam  Constitutional: She is oriented to person, place, and time. She appears well-developed and well-nourished.  HENT:  Head: Normocephalic.  Nose: Nose normal.  Mouth/Throat: Oropharynx is clear and moist.  Eyes: Conjunctivae are normal. Pupils are equal, round, and reactive to light.  Neck: Normal range of motion. Neck supple. No JVD present.  Cardiovascular: Normal rate, regular rhythm, normal heart sounds and intact distal pulses.  Exam reveals no gallop and no  friction rub.   No murmur heard. Pulmonary/Chest: Effort normal and breath sounds normal. No respiratory distress. She has no wheezes. She has no rales. She exhibits no tenderness.  Abdominal: Soft. Bowel sounds are normal. She exhibits no distension. There is no tenderness.  Musculoskeletal: Normal range of motion. She exhibits no edema or tenderness.  Lymphadenopathy:    She has no cervical adenopathy.  Neurological: She is  alert and oriented to person, place, and time. Coordination normal.  Skin: Skin is warm and dry. No rash noted. No erythema.  Psychiatric: She has a normal mood and affect. Her behavior is normal. Judgment and thought content normal.

## 2015-06-27 NOTE — Patient Instructions (Addendum)
For low blood pressure, Please start florinef/fludrocortisone one pill every other day  (0.1 mg)  Other medication we could use, Is called midodrine  Please consider wearing compression hose when standing  Call for a 30 day monitor if needed for bradycardia (slow heart rate)  Please call us if you have new issues that need to be addressed before your next appt.  Your physician wants you to follow-up in: 2 months  Cardiac Event Monitoring A cardiac event monitor is a small recording device used to help detect abnormal heart rhythms (arrhythmias). The monitor is used to record heart rhythm when noticeable symptoms such as the following occur:  Fast heartbeats (palpitations), such as heart racing or fluttering.  Dizziness.  Fainting or light-headedness.  Unexplained weakness. The monitor is wired to two electrodes placed on your chest. Electrodes are flat, sticky disks that attach to your skin. The monitor can be worn for up to 30 days. You will wear the monitor at all times, except when bathing.  HOW TO USE YOUR CARDIAC EVENT MONITOR A technician will prepare your chest for the electrode placement. The technician will show you how to place the electrodes, how to work the monitor, and how to replace the batteries. Take time to practice using the monitor before you leave the office. Make sure you understand how to send the information from the monitor to your health care provider. This requires a telephone with a landline, not a cell phone. You need to:  Wear your monitor at all times, except when you are in water:  Do not get the monitor wet.  Take the monitor off when bathing. Do not swim or use a hot tub with it on.  Keep your skin clean. Do not put body lotion or moisturizer on your chest.  Change the electrodes daily or any time they stop sticking to your skin. You might need to use tape to keep them on.  It is possible that your skin under the electrodes could become irritated.  To keep this from happening, try to put the electrodes in slightly different places on your chest. However, they must remain in the area under your left breast and in the upper right section of your chest.  Make sure the monitor is safely clipped to your clothing or in a location close to your body that your health care provider recommends.  Press the button to record when you feel symptoms of heart trouble, such as dizziness, weakness, light-headedness, palpitations, thumping, shortness of breath, unexplained weakness, or a fluttering or racing heart. The monitor is always on and records what happened slightly before you pressed the button, so do not worry about being too late to get good information.  Keep a diary of your activities, such as walking, doing chores, and taking medicine. It is especially important to note what you were doing when you pushed the button to record your symptoms. This will help your health care provider determine what might be contributing to your symptoms. The information stored in your monitor will be reviewed by your health care provider alongside your diary entries.  Send the recorded information as recommended by your health care provider. It is important to understand that it will take some time for your health care provider to process the results.  Change the batteries as recommended by your health care provider. SEEK IMMEDIATE MEDICAL CARE IF:   You have chest pain.  You have extreme difficulty breathing or shortness of breath.  You develop a  very fast heartbeat that persists.  You develop dizziness that does not go away.  You faint or constantly feel you are about to faint. Document Released: 07/06/2008 Document Revised: 02/11/2014 Document Reviewed: 03/26/2013 [employer] Patient Information 2015 , [place]. This information is not intended to replace advice given to you by your health care provider. Make sure you discuss any questions you have with  your health care provider. Compression Stockings Compression stockings are elastic stockings that "compress" your legs. This helps to increase blood flow, decrease swelling, and reduces the chance of getting blood clots in your lower legs. Compression stockings are used:  After surgery.  If you have a history of poor circulation.  If you are prone to blood clots.  If you have varicose veins.  If you sit or are bedridden for long periods of time. WEARING COMPRESSION STOCKINGS  Your compression stockings should be worn as instructed by your caregiver.  Wearing the correct stocking size is important. Your caregiver can help measure and fit you to the correct size.  When wearing your stockings, do not allow the stockings to bunch up. This is especially important around your toes or behind your knees. Keep the stockings as smooth as possible.  Do not roll the stockings downward and leave them rolled down. This can form a restrictive band around your legs and can decrease blood flow.  The stockings should be removed once a day for 1 hour or as instructed by your caregiver. When the stockings are taken off, inspect your legs and feet. Look for:  Open sores.  Red spots.  Puffy areas (swelling).  Anything that does not seem normal. IMPORTANT INFORMATION ABOUT COMPRESSION STOCKINGS  The compression stockings should be clean, dry, and in good condition before you put them on.  Do not put lotion on your legs or feet. This makes it harder to put the stockings on.  Change your stockings immediately if they become wet or soiled.  Do not wear stockings that are ripped or torn.  You may hand-wash or put your stockings in the washing machine. Use cold or warm water with mild detergent. Do not bleach your stockings. They may be air-dried or dried in the dryer on low heat.  If you have pain or have a feeling of "pins and needles" in your feet or legs, you may be wearing stockings that are too  tight. Call your caregiver right away. SEEK IMMEDIATE MEDICAL CARE IF:   You have numbness or tingling in your lower legs that does not get better quickly after the stockings are removed.  Your toes or feet become cold and blue.  You develop open sores or have red spots on your legs that do not go away. MAKE SURE YOU:   Understand these instructions.  Will watch your condition.  Will get help right away if you are not doing well or get worse. Document Released: 07/25/2009 Document Revised: 12/20/2011 Document Reviewed: 07/25/2009 [facility] Patient Information 2015 [place], [place]. This information is not intended to replace advice given to you by your health care provider. Make sure you discuss any questions you have with your health care provider.

## 2015-06-27 NOTE — Assessment & Plan Note (Signed)
If bradycardia is a concern, 30 day monitor could be ordered. Husband reports that she has worn a 30 day monitor in the past which was unrevealing She'll continue to monitor her heart rate when she has symptoms

## 2015-06-27 NOTE — Assessment & Plan Note (Signed)
Symptoms concerning for orthostasis. Not typically positional, though certainly happens when she is standing. Recommended compression hose, will start fludrocortisone 0.1 mg every other day. Other option would be midodrine but dosing is less convenient and unclear if this may exacerbate her migraines. Suggested she monitor her blood pressure at home particularly when she has episodes. If she continues to have symptoms associated with low blood pressure, could increase the Florinef up to daily dosing.

## 2015-07-22 ENCOUNTER — Telehealth: Payer: Self-pay | Admitting: *Deleted

## 2015-07-22 DIAGNOSIS — R001 Bradycardia, unspecified: Secondary | ICD-10-CM

## 2015-07-22 NOTE — Telephone Encounter (Signed)
Order placed w/ [employer].  They will contact pt before mailing.

## 2015-07-22 NOTE — Telephone Encounter (Signed)
Pt would like to do the 30 day monitor  Please call when ready.

## 2015-07-29 ENCOUNTER — Telehealth: Payer: Self-pay | Admitting: Cardiovascular Disease

## 2015-07-29 NOTE — Telephone Encounter (Signed)
Called [employer].  They state that pt was "queued" and was not activated, so shipment was pending.  She will activate pt's enrollment, call pt to verify address and get monitor shipped out today.   We will need to push her appt out to about 6 weeks to make sure that results are back before her appt.  Thank you!

## 2015-07-29 NOTE — Telephone Encounter (Signed)
Patient hasn't heard anything from [employer] yet .    Patient has a appt on 11/16 and may need to push out until Moody AFBGollan can get /read results

## 2015-07-30 NOTE — Telephone Encounter (Signed)
LMOV for patient.

## 2015-08-01 ENCOUNTER — Encounter (INDEPENDENT_AMBULATORY_CARE_PROVIDER_SITE_OTHER): Payer: 59

## 2015-08-01 DIAGNOSIS — R001 Bradycardia, unspecified: Secondary | ICD-10-CM

## 2015-08-27 ENCOUNTER — Ambulatory Visit: Payer: 59 | Admitting: Cardiovascular Disease

## 2015-08-28 ENCOUNTER — Other Ambulatory Visit: Payer: Self-pay | Admitting: Family Medicine

## 2015-08-28 ENCOUNTER — Other Ambulatory Visit (INDEPENDENT_AMBULATORY_CARE_PROVIDER_SITE_OTHER): Payer: 59

## 2015-08-28 DIAGNOSIS — I951 Orthostatic hypotension: Secondary | ICD-10-CM

## 2015-08-28 LAB — CBC WITH DIFFERENTIAL/PLATELET
BASOS ABS: 0 10*3/uL (ref 0.0–0.1)
BASOS PCT: 0.7 % (ref 0.0–3.0)
Eosinophils Absolute: 0 10*3/uL (ref 0.0–0.7)
Eosinophils Relative: 0.9 % (ref 0.0–5.0)
HEMATOCRIT: 40 % (ref 36.0–46.0)
HEMOGLOBIN: 13.4 g/dL (ref 12.0–15.0)
LYMPHS PCT: 39.3 % (ref 12.0–46.0)
Lymphs Abs: 1.5 10*3/uL (ref 0.7–4.0)
MCHC: 33.6 g/dL (ref 30.0–36.0)
MCV: 92.4 fl (ref 78.0–100.0)
MONOS PCT: 6.9 % (ref 3.0–12.0)
Monocytes Absolute: 0.3 10*3/uL (ref 0.1–1.0)
NEUTROS ABS: 2 10*3/uL (ref 1.4–7.7)
Neutrophils Relative %: 52.2 % (ref 43.0–77.0)
PLATELETS: 183 10*3/uL (ref 150.0–400.0)
RBC: 4.33 Mil/uL (ref 3.87–5.11)
RDW: 13.1 % (ref 11.5–15.5)
WBC: 3.9 10*3/uL — ABNORMAL LOW (ref 4.0–10.5)

## 2015-08-28 LAB — BASIC METABOLIC PANEL
BUN: 11 mg/dL (ref 6–23)
CALCIUM: 9.3 mg/dL (ref 8.4–10.5)
CHLORIDE: 110 meq/L (ref 96–112)
CO2: 25 meq/L (ref 19–32)
Creatinine, Ser: 0.88 mg/dL (ref 0.40–1.20)
GFR: 77.75 mL/min (ref >60.00)
Glucose, Bld: 84 mg/dL (ref 70–99)
Potassium: 4 mEq/L (ref 3.5–5.1)
SODIUM: 142 meq/L (ref 135–145)

## 2015-08-28 LAB — TSH: TSH: 0.66 u[IU]/mL (ref 0.35–4.50)

## 2015-09-09 ENCOUNTER — Ambulatory Visit (INDEPENDENT_AMBULATORY_CARE_PROVIDER_SITE_OTHER): Payer: 59 | Admitting: Family Medicine

## 2015-09-09 ENCOUNTER — Encounter: Payer: Self-pay | Admitting: Family Medicine

## 2015-09-09 VITALS — BP 102/62 | HR 67 | Temp 98.0°F | Ht 65.75 in | Wt 152.2 lb

## 2015-09-09 DIAGNOSIS — Z0000 Encounter for general adult medical examination without abnormal findings: Secondary | ICD-10-CM | POA: Diagnosis not present

## 2015-09-09 DIAGNOSIS — I951 Orthostatic hypotension: Secondary | ICD-10-CM

## 2015-09-09 DIAGNOSIS — G43709 Chronic migraine without aura, not intractable, without status migrainosus: Secondary | ICD-10-CM

## 2015-09-09 DIAGNOSIS — Z01419 Encounter for gynecological examination (general) (routine) without abnormal findings: Secondary | ICD-10-CM

## 2015-09-09 LAB — LIPID PANEL
CHOLESTEROL: 178 mg/dL (ref 0–200)
HDL: 69.2 mg/dL (ref >39.00)
LDL Cholesterol: 100 mg/dL — ABNORMAL HIGH (ref 0–99)
NonHDL: 108.71
TRIGLYCERIDES: 42 mg/dL (ref 0.0–149.0)
Total CHOL/HDL Ratio: 3
VLDL: 8.4 mg/dL (ref 0.0–40.0)

## 2015-09-09 NOTE — Patient Instructions (Signed)
Great to see you. Please keep us updated. I will call you with your cholesterol results.  Happy Holidays!

## 2015-09-09 NOTE — Progress Notes (Signed)
Subjective:   Patient ID: Danielle BentAmanda M Schneider, female    DOB: 12/08/1980, 34 y.o.   MRN: [id number]  Danielle Schneider is a pleasant 34 y.o. year old female who presents to clinic today with Annual Exam and Migraine  on 09/09/2015  HPI:  Last pap smear was 09/21/13- has GYN.  Hypotension and bradycardia- saw Dr. Mariah MillingGollan on 06/27/15. Note reviewed. Recommended compression hose and fludrocortisone 0.1 mg every other day along with 30 day heart monitor if bradycardia perists.  She did wear heart monitor and sent it back on 11/20- has follow up on 09/12/2015.  Stopped taking fludrocortisone because she felt it wasn't helping.  Still having these episodes of feeling "fuzzy headed." No syncope.   Lab Results  Component Value Date   CHOL 173 11/26/2013   HDL 58.00 11/26/2013   LDLCALC 100* 11/26/2013   TRIG 73.0 11/26/2013   CHOLHDL 3 11/26/2013   Lab Results  Component Value Date   CREATININE 0.88 08/28/2015   Lab Results  Component Value Date   NA 142 08/28/2015   K 4.0 08/28/2015   CL 110 08/28/2015   CO2 25 08/28/2015   Lab Results  Component Value Date   WBC 3.9* 08/28/2015   HGB 13.4 08/28/2015   HCT 40.0 08/28/2015   MCV 92.4 08/28/2015   PLT 183.0 08/28/2015   Lab Results  Component Value Date   TSH 0.66 08/28/2015   Current Outpatient Prescriptions on File Prior to Visit  Medication Sig Dispense Refill  . fludrocortisone (FLORINEF) 0.1 MG tablet Take 1 tablet (0.1 mg total) by mouth daily. 30 tablet 6  . zonisamide (ZONEGRAN) 100 MG capsule Take 1 capsule (100 mg total) by mouth daily. 30 capsule 5   No current facility-administered medications on file prior to visit.    Allergies  Allergen Reactions  . Vicodin [Hydrocodone-Acetaminophen] Other (See Comments)    Nausea, dizziness, cold sweats    Past Medical History  Diagnosis Date  . History of nephrolithiasis 02/2011  . Abnormal Pap smear   . Anal sphincter incontinence after fourth degree tear   resolved as of 2012  . Panic attack     history of    Past Surgical History  Procedure Laterality Date  . Shoulder open rotator cuff repair  2004  . Rib cage  2006    rib cage bone removal  . Cesarean section    . Post partum sterilization    . Shoulder surgery    . Thoracic outlet surgery      Family History  Problem Relation Age of Onset  . Heart disease Father     MI in 740s    Social History   Social History  . Marital Status: Married    Spouse Name: N/A  . Number of Children: N/A  . Years of Education: N/A   Occupational History  . Not on file.   Social History Main Topics  . Smoking status: Former Smoker    Quit date: 04/16/2008  . Smokeless tobacco: Never Used  . Alcohol Use: Yes     Comment: occasionally  . Drug Use: No  . Sexual Activity:    Partners: Male    Birth Control/ Protection: Surgical   Other Topics Concern  . Not on file   Social History Narrative   The PMH, PSH, Social History, Family History, Medications, and allergies have been [facility], and have been updated if relevant.   Review of Systems  Constitutional: Negative.  Eyes: Negative.   Respiratory: Negative.   Cardiovascular: Negative.   Gastrointestinal: Negative.   Endocrine: Negative.   Genitourinary: Negative.   Musculoskeletal: Negative.   Neurological: Positive for dizziness, light-headedness and headaches. Negative for seizures, syncope, facial asymmetry and speech difficulty.  Hematological: Negative.   Psychiatric/Behavioral: Negative.   All other systems reviewed and are negative.      Objective:    BP 102/62 mmHg  Pulse 67  Temp(Src) 98 F (36.7 C) (Oral)  Ht 5' 5.75" (1.67 m)  Wt 152 lb 4 oz (69.06 kg)  BMI 24.76 kg/m2  SpO2 98%  LMP 08/18/2015   Physical Exam   General:  Well-developed,well-nourished,in no acute distress; alert,appropriate and cooperative throughout examination Head:  normocephalic and atraumatic.   Eyes:  vision grossly  intact, pupils equal, pupils round, and pupils reactive to light.   Ears:  R ear normal and L ear normal.   Nose:  no external deformity.   Mouth:  good dentition.   Neck:  No deformities, masses, or tenderness noted. Lungs:  Normal respiratory effort, chest expands symmetrically. Lungs are clear to auscultation, no crackles or wheezes. Heart:  Normal rate and regular rhythm. S1 and S2 normal without gallop, murmur, click, rub or other extra sounds. Abdomen:  Bowel sounds positive,abdomen soft and non-tender without masses, organomegaly or hernias noted. Msk:  No deformity or scoliosis noted of thoracic or lumbar spine.   Extremities:  No clubbing, cyanosis, edema, or deformity noted with normal full range of motion of all joints.   Neurologic:  alert & oriented X3 and gait normal.   Skin:  Intact without suspicious lesions or rashes Cervical Nodes:  No lymphadenopathy noted Axillary Nodes:  No palpable lymphadenopathy Psych:  Cognition and judgment appear intact. Alert and cooperative with normal attention span and concentration. No apparent delusions, illusions, hallucinations       Assessment & Plan:   Well woman exam  Chronic migraine without aura without status migrainosus, not intractable No Follow-up on file.

## 2015-09-09 NOTE — Progress Notes (Signed)
Pre visit review using our clinic review tool, if applicable. No additional management support is needed unless otherwise documented below in the visit note. 

## 2015-09-09 NOTE — Assessment & Plan Note (Signed)
Did not respond to fludricortisone. Follow up with Dr. Mariah MillingGollan, will follow.

## 2015-09-09 NOTE — Assessment & Plan Note (Signed)
Reviewed preventive care protocols, scheduled due services, and updated immunizations Discussed nutrition, exercise, diet, and healthy lifestyle.  

## 2015-09-11 ENCOUNTER — Encounter: Payer: 59 | Admitting: Family Medicine

## 2015-09-12 ENCOUNTER — Encounter: Payer: Self-pay | Admitting: Cardiovascular Disease

## 2015-09-12 ENCOUNTER — Ambulatory Visit (INDEPENDENT_AMBULATORY_CARE_PROVIDER_SITE_OTHER): Payer: 59 | Admitting: Cardiovascular Disease

## 2015-09-12 VITALS — BP 118/75 | HR 66 | Ht 65.5 in | Wt 151.8 lb

## 2015-09-12 DIAGNOSIS — R001 Bradycardia, unspecified: Secondary | ICD-10-CM | POA: Diagnosis not present

## 2015-09-12 DIAGNOSIS — R42 Dizziness and giddiness: Secondary | ICD-10-CM | POA: Diagnosis not present

## 2015-09-12 DIAGNOSIS — R0602 Shortness of breath: Secondary | ICD-10-CM | POA: Diagnosis not present

## 2015-09-12 DIAGNOSIS — I951 Orthostatic hypotension: Secondary | ICD-10-CM | POA: Diagnosis not present

## 2015-09-12 DIAGNOSIS — F419 Anxiety disorder, unspecified: Secondary | ICD-10-CM

## 2015-09-12 DIAGNOSIS — R5382 Chronic fatigue, unspecified: Secondary | ICD-10-CM

## 2015-09-12 MED ORDER — MIDODRINE HCL 10 MG PO TABS: 10.0000 mg | ORAL_TABLET | Freq: Three times a day (TID) | ORAL | Status: DC | PRN

## 2015-09-12 NOTE — Assessment & Plan Note (Signed)
Shortness of breath. New symptom discussed on today's visit, etiology not clear, no cardiac reason for her shortness of breath. Normal echocardiogram in the past, essentially normal 30 day monitor.  Recommended regular exercise program. Currently takes care of 3 children, no exercise

## 2015-09-12 NOTE — Assessment & Plan Note (Signed)
She reported having chronic fatigue on today's visit. Certainly possible that with 3 children, busy work schedule, she is "burning the candle". We did discuss this with her, need for time for herself, exercise program. She reports sleeping well, less likely sleep apnea

## 2015-09-12 NOTE — Assessment & Plan Note (Signed)
No documentation of her drop in her blood pressure on either visit No improvement in her symptoms with Florinef, liberalizing her salt intake and fluids, compression hose. Suspect her episodes of lightheadedness may be noncardiac

## 2015-09-12 NOTE — Assessment & Plan Note (Signed)
Symptoms of fatigue, shortness of breath, acute onset of lightheadedness, no documentation of anything on event monitor or by orthostatics to support symptoms. Concerning for anxiety/depression. Recommended a regular exercise program

## 2015-09-12 NOTE — Progress Notes (Signed)
Patient ID: Danielle Schneider, female    DOB: 05/13/1981, 34 y.o.   MRN: [id number]  HPI Comments: [name] is a 34 year old woman with long history of migraines, previously seen for hypotension and bradycardia, episodes of lightheadedness.  Main symptoms today include shortness of breath on exertion, chronic fatigue, continued episodes of lightheadedness that, on acutely possibly associated with bradycardia.  In an effort to support her blood pressure, she was started on Florinef 0.1 minute grams every other day. She liberalize her salt intake. Reports it did not really help her symptoms, and she stopped Florinef as she had swelling in her face  She reports that she continues to have rare episodes of lightheadedness, Reports having recordings of low heart rate, low blood pressure, lightheadedness that percent acutely sometimes while at work. She denies having panic attacks.  30 day monitor was ordered that showed rare PVCs, otherwise normal sinus rhythm, no other significant arrhythmia, no periods of bradycardia   long history of low blood pressure. No regular exercise program. Reports that she has been pushing the fluids Takes care of 3 children, works most of the day  Orthostatics done in the office on her first clinic visit with systolic pressure ranging from 105 up to 117 systolic, no significant change going from supine to standing position  Orthostatics done again today with systolic pressure 108/65 supine, rate 64, blood pressure actually increased with standing up to 130/80, heart rate 75. No drop in blood pressure  EKG on today's visit shows normal sinus rhythm with rate 66 bpm, no significant ST or T-wave changes  Normal echocardiogram in 2013  She reports long protracted migraines despite taking medications   Allergies  Allergen Reactions  . Vicodin [Hydrocodone-Acetaminophen] Other (See Comments)    Nausea, dizziness, cold sweats    Current Outpatient Prescriptions  on File Prior to Visit  Medication Sig Dispense Refill  . zonisamide (ZONEGRAN) 100 MG capsule Take 1 capsule (100 mg total) by mouth daily. 30 capsule 5   No current facility-administered medications on file prior to visit.    Past Medical History  Diagnosis Date  . History of nephrolithiasis 02/2011  . Abnormal Pap smear   . Anal sphincter incontinence after fourth degree tear    resolved as of 2012  . Panic attack     history of    Past Surgical History  Procedure Laterality Date  . Shoulder open rotator cuff repair  2004  . Rib cage  2006    rib cage bone removal  . Cesarean section    . Post partum sterilization    . Shoulder surgery    . Thoracic outlet surgery      Social History  reports that she quit smoking about 7 years ago. She has never used smokeless tobacco. She reports that she drinks alcohol. She reports that she does not use illicit drugs.  Family History family history includes Heart disease in her father.   Review of Systems  Constitutional: Negative.   Respiratory: Negative.   Cardiovascular: Negative.   Gastrointestinal: Negative.   Musculoskeletal: Negative.   Neurological: Negative.   Hematological: Negative.   Psychiatric/Behavioral: Negative.   All other systems reviewed and are negative.   BP 118/75 mmHg  Pulse 66  Ht 5' 5.5" (1.664 m)  Wt 151 lb 12 oz (68.833 kg)  BMI 24.86 kg/m2  LMP 08/18/2015  Physical Exam  Constitutional: She is oriented to person, place, and time. She appears well-developed and well-nourished.  HENT:  Head: Normocephalic.  Nose: Nose normal.  Mouth/Throat: Oropharynx is clear and moist.  Eyes: Conjunctivae are normal. Pupils are equal, round, and reactive to light.  Neck: Normal range of motion. Neck supple. No JVD present.  Cardiovascular: Normal rate, regular rhythm, normal heart sounds and intact distal pulses.  Exam reveals no gallop and no friction rub.   No murmur heard. Pulmonary/Chest: Effort  normal and breath sounds normal. No respiratory distress. She has no wheezes. She has no rales. She exhibits no tenderness.  Abdominal: Soft. Bowel sounds are normal. She exhibits no distension. There is no tenderness.  Musculoskeletal: Normal range of motion. She exhibits no edema or tenderness.  Lymphadenopathy:    She has no cervical adenopathy.  Neurological: She is alert and oriented to person, place, and time. Coordination normal.  Skin: Skin is warm and dry. No rash noted. No erythema.  Psychiatric: She has a normal mood and affect. Her behavior is normal. Judgment and thought content normal.

## 2015-09-12 NOTE — Patient Instructions (Addendum)
You are doing well.  Ok to try midodrine 1/2 pill up to a whole pill as needed for blood pressure drops  Please call us if you have new issues that need to be addressed before your next appt.

## 2015-09-12 NOTE — Assessment & Plan Note (Signed)
Recent 30 day monitor with rare PVCs, no significant bradycardia (bradycardia for several seconds while having PVCs should not call symptoms). No further cardiac workup needed

## 2016-07-09 ENCOUNTER — Emergency Department
Admission: EM | Admit: 2016-07-09 | Discharge: 2016-07-09 | Disposition: A | Discharge status: Home / Self Care | Payer: 59 | Attending: Student | Admitting: Student

## 2016-07-09 ENCOUNTER — Emergency Department: Payer: 59

## 2016-07-09 DIAGNOSIS — G43109 Migraine with aura, not intractable, without status migrainosus: Secondary | ICD-10-CM

## 2016-07-09 DIAGNOSIS — Z87891 Personal history of nicotine dependence: Secondary | ICD-10-CM | POA: Insufficient documentation

## 2016-07-09 DIAGNOSIS — M5412 Radiculopathy, cervical region: Secondary | ICD-10-CM

## 2016-07-09 DIAGNOSIS — R202 Paresthesia of skin: Secondary | ICD-10-CM

## 2016-07-09 DIAGNOSIS — R200 Anesthesia of skin: Secondary | ICD-10-CM

## 2016-07-09 DIAGNOSIS — Z79899 Other long term (current) drug therapy: Secondary | ICD-10-CM | POA: Diagnosis not present

## 2016-07-09 LAB — URINALYSIS COMPLETE WITH MICROSCOPIC (ARMC ONLY)
Bilirubin Urine: NEGATIVE
Glucose, UA: NEGATIVE mg/dL
KETONES UR: NEGATIVE mg/dL
Nitrite: NEGATIVE
PH: 5 (ref 5.0–8.0)
PROTEIN: NEGATIVE mg/dL
SPECIFIC GRAVITY, URINE: 1.006 (ref 1.005–1.030)

## 2016-07-09 LAB — COMPREHENSIVE METABOLIC PANEL
ALBUMIN: 4.3 g/dL (ref 3.5–5.0)
ALT: 13 U/L — AB (ref 14–54)
AST: 18 U/L (ref 15–41)
Alkaline Phosphatase: 58 U/L (ref 38–126)
Anion gap: 5 (ref 5–15)
BUN: 13 mg/dL (ref 6–20)
CHLORIDE: 108 mmol/L (ref 101–111)
CO2: 27 mmol/L (ref 22–32)
CREATININE: 0.72 mg/dL (ref 0.44–1.00)
Calcium: 8.9 mg/dL (ref 8.9–10.3)
GFR calc non Af Amer: >60 mL/min (ref >60)
GLUCOSE: 90 mg/dL (ref 65–99)
Potassium: 3.6 mmol/L (ref 3.5–5.1)
SODIUM: 140 mmol/L (ref 135–145)
Total Bilirubin: 0.7 mg/dL (ref 0.3–1.2)
Total Protein: 7.3 g/dL (ref 6.5–8.1)

## 2016-07-09 LAB — MAGNESIUM: Magnesium: 2 mg/dL (ref 1.7–2.4)

## 2016-07-09 LAB — CBC
HCT: 41.2 % (ref 35.0–47.0)
Hemoglobin: 13.9 g/dL (ref 12.0–16.0)
MCH: 31.4 pg (ref 26.0–34.0)
MCHC: 33.6 g/dL (ref 32.0–36.0)
MCV: 93.4 fL (ref 80.0–100.0)
PLATELETS: 178 10*3/uL (ref 150–440)
RBC: 4.41 MIL/uL (ref 3.80–5.20)
RDW: 12.7 % (ref 11.5–14.5)
WBC: 5.8 10*3/uL (ref 3.6–11.0)

## 2016-07-09 LAB — POCT PREGNANCY, URINE: PREG TEST UR: NEGATIVE

## 2016-07-09 MED ORDER — DIPHENHYDRAMINE HCL 50 MG/ML IJ SOLN
12.5000 mg | Freq: Once | INTRAMUSCULAR | Status: AC
  Administered 2016-07-09: 12.5 mg via INTRAVENOUS
  Filled 2016-07-09: qty 1

## 2016-07-09 MED ORDER — METOCLOPRAMIDE HCL 5 MG/ML IJ SOLN
10.0000 mg | Freq: Once | INTRAMUSCULAR | Status: AC
  Administered 2016-07-09: 10 mg via INTRAVENOUS
  Filled 2016-07-09: qty 2

## 2016-07-09 MED ORDER — KETOROLAC TROMETHAMINE 30 MG/ML IJ SOLN
15.0000 mg | Freq: Once | INTRAMUSCULAR | Status: AC
  Administered 2016-07-09: 15 mg via INTRAVENOUS
  Filled 2016-07-09: qty 1

## 2016-07-09 MED ORDER — SODIUM CHLORIDE 0.9 % IV BOLUS (SEPSIS)
1000.0000 mL | Freq: Once | INTRAVENOUS | Status: AC
Start: 2016-07-09 — End: 2016-07-09
  Administered 2016-07-09: 1000 mL via INTRAVENOUS

## 2016-07-09 NOTE — ED Notes (Signed)
Attempted to call MRI at this time. No answer. Will try again later.

## 2016-07-09 NOTE — ED Notes (Signed)
Patient transported to MRI 

## 2016-07-09 NOTE — ED Notes (Signed)
Patient c/o pain while pushing and pull with right arm. NIH 1 due to "feeling less" on right side of face and arm.

## 2016-07-09 NOTE — ED Notes (Signed)
Patient transported to radiology

## 2016-07-09 NOTE — ED Notes (Signed)
Patient returned from MRI.

## 2016-07-09 NOTE — ED Provider Notes (Addendum)
[facility] Emergency Department Provider Note   ____________________________________________   First MD Initiated Contact with Patient 07/09/16 1129     (approximate)  I have reviewed the triage vital signs and the nursing notes.   HISTORY  Chief Complaint Tingling (right arm)    HPI Danielle Schneider is a 35 y.o. female with history of migraines, orthostatic hypotension, bradycardia, surgical history including right open rotator cuff operation as well as removal of the portion of the right first rib for thoracic outlet obstruction who presents for evaluation of 2 weeks of intermittent burning right arm pain and tingling, gradual onset, moderate, pain is worse after a long work today and worse when she moves the right arm. She reports that she has been having this arm pain and numbness/tingling all throughout the day but it became more noticeable at 9 AM and at around 9 AM she started to notice numbness in the right side of the face as well. She has never had numbness in the right face before. She denies any associated weakness, no chest pain, no difficulty breathing, no word finding difficulties, no vision change. No family history of early CVA, no personal history of CVA. She denies any new trauma to the right arm/shoulder. He has been having her typical migraine headache since last night but her pain is only mild.   Past Medical History:  Diagnosis Date  . Abnormal Pap smear   . Anal sphincter incontinence after fourth degree tear   resolved as of 2012  . History of nephrolithiasis 02/2011  . Panic attack    history of    Patient Active Problem List   Diagnosis Date Noted  . Shortness of breath 09/12/2015  . Chronic fatigue 09/12/2015  . Well woman exam 09/09/2015  . Orthostatic hypotension 06/27/2015  . Bradycardia 06/27/2015  . Hypotension 05/17/2014  . 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 Migraine 11/15/2012  . Anxiety 05/26/2011     Past Surgical History:  Procedure Laterality Date  . CESAREAN SECTION    . post partum sterilization    . rib cage  2006   rib cage bone removal  . SHOULDER OPEN ROTATOR CUFF REPAIR  2004  . SHOULDER SURGERY    . THORACIC OUTLET SURGERY      Prior to Admission medications   Medication Sig Start Date End Date Taking? Authorizing Provider  midodrine (PROAMATINE) 10 MG tablet Take 1 tablet (10 mg total) by mouth 3 (three) times daily as needed. 09/12/15  Yes Antonieta Ibaimothy J Gollan, MD  zonisamide (ZONEGRAN) 100 MG capsule Take 1 capsule (100 mg total) by mouth daily. 05/22/15  Yes Dianne Dunalia M Aron, MD    Allergies Vicodin [hydrocodone-acetaminophen]  Family History  Problem Relation Age of Onset  . Heart disease Father     MI in 1640s    Social History Social History  Substance Use Topics  . Smoking status: Former Smoker    Quit date: 04/16/2008  . Smokeless tobacco: Never Used  . Alcohol use Yes     Comment: occasionally    Review of Systems Constitutional: No fever/chills Eyes: No visual changes. ENT: No sore throat. Cardiovascular: Denies chest pain. Respiratory: Denies shortness of breath. Gastrointestinal: No abdominal pain.  No nausea, no vomiting.  No diarrhea.  No constipation. Genitourinary: Negative for dysuria. Musculoskeletal: Negative for back pain. Skin: Negative for rash. Neurological: Positive for headache, no focal weakness, + right arm pain and numbness.  10-point ROS otherwise negative.  ____________________________________________   PHYSICAL EXAM:  Vitals:   07/09/16 1112 07/09/16 1203 07/09/16 1333 07/09/16 1400  BP: 114/69 118/72 103/68 113/72  Pulse: 69 (!) 56 95 (!) 55  Resp: 18 17 17 19   Temp: 98.6 F (37 C)     TempSrc: Oral     SpO2: 100% 100% 99% 99%  Weight: 151 lb (68.5 kg)     Height: 5\' 6"  (1.676 m)       VITAL SIGNS: ED Triage Vitals  Enc Vitals Group     BP 07/09/16 1112 114/69     Pulse Rate 07/09/16 1112 69     Resp  07/09/16 1112 18     Temp 07/09/16 1112 98.6 F (37 C)     Temp Source 07/09/16 1112 Oral     SpO2 07/09/16 1112 100 %     Weight 07/09/16 1112 151 lb (68.5 kg)     Height 07/09/16 1112 5\' 6"  (1.676 m)     Head Circumference --      Peak Flow --      Pain Score 07/09/16 1113 7     Pain Loc --      Pain Edu? --      Excl. in GC? --     Constitutional: Alert and oriented. Well appearing and in no acute distress. Eyes: Conjunctivae are normal. PERRL. EOMI. Head: Atraumatic. Nose: No congestion/rhinnorhea. Mouth/Throat: Mucous membranes are moist.  Oropharynx non-erythematous. Neck: No stridor.  Supple without meningismus. Cardiovascular: Normal rate, regular rhythm. Grossly normal heart sounds.  Good peripheral circulation. Respiratory: Normal respiratory effort.  No retractions. Lungs CTAB. Gastrointestinal: Soft and nontender. No distention.  No CVA tenderness. Genitourinary: Deferred Musculoskeletal: No lower extremity tenderness nor edema.  No joint effusions.2+ right radial pulse. Neurologic:  Normal speech and language. 4+ out of 5 strength in the right upper extremity however strength exam is limited due to pain in the right arm. 5 out of 5 strength in the left upper extremity and bilateral lower extremities. Sensation intact to light touch in bilateral lower extremities as well as the left arm and face. Decreased sensation to light touch in the right face as well as the right upper extremity. Cranial nerves II through XII intact with the exception of the above sensory deficit in cranial nerve V1, V2, V3 distribution. Skin:  Skin is warm, dry and intact. No rash noted. Psychiatric: Mood and affect are normal. Speech and behavior are normal.  ____________________________________________   LABS (all labs ordered are listed, but only abnormal results are displayed)  Labs Reviewed  COMPREHENSIVE METABOLIC PANEL - Abnormal; Notable for the following:       Result Value   ALT 13  (*)    All other components within normal limits  URINALYSIS COMPLETEWITH MICROSCOPIC ([facility] ONLY) - Abnormal; Notable for the following:    Color, Urine YELLOW (*)    APPearance CLOUDY (*)    Hgb urine dipstick 2+ (*)    Leukocytes, UA 3+ (*)    Bacteria, UA FEW (*)    Squamous Epithelial / LPF 6-30 (*)    All other components within normal limits  URINE CULTURE  CBC  MAGNESIUM  POC URINE PREG, ED  POCT PREGNANCY, URINE   ____________________________________________  EKG  ED ECG REPORT I, Gayla Doss, the attending physician, personally viewed and interpreted this ECG.   Date: 07/09/2016  EKG Time: 11:21  Rate: 66  Rhythm: normal EKG, normal sinus rhythm  Axis: normal  Intervals:none  ST&T Change: No acute ST elevation or acute ST depression.  ____________________________________________  RADIOLOGY  MRI brain  IMPRESSION:  1. Stable and negative brain MRI.  2. 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  3. Left sphenoid sinusitis.      MRI cervical spine IMPRESSION:  1. Stable and negative brain MRI.  2. 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  3. Left sphenoid sinusitis.      CXR   IMPRESSION:  No acute disease.       ____________________________________________   PROCEDURES  Procedure(s) performed: None  Procedures  Critical Care performed: No  ____________________________________________   INITIAL IMPRESSION / ASSESSMENT AND PLAN / ED COURSE  Pertinent labs & imaging results that were available during my care of the patient were reviewed by me and considered in my medical decision making (see chart for details).  Danielle Schneider is a 35 y.o. female with history of migraines, orthostatic hypotension, surgical history including right open rotator cuff operation as well as removal of the portion of the right first rib for thoracic outlet  obstruction who presents for evaluation of 2 weeks of intermittent right arm pain and tingling as well as new numbness in the right face today. On exam, she is very well-appearing and in no acute distress. Vital signs stable, she is afebrile. NIH stroke scale is 1 secondary to mild sensory deficit in the right face and right upper extremity and given his symptoms ongoing all day and minimal deficits, she is not a candidate for TPA. Given the pain in her right arm as well as tingling, I suspect that this may be related to a cervical radiculopathy however given that she is now having some facial involvement, we'll obtain MRI of the brain to evaluate for CVA as well as MRI of the C-spine to evaluate for acute cervical myelopathy. She is also having a migraine and the facial numbness may be related to an complicated migraine. We'll treat her symptomatically for migraine, obtain screening labs and urinalysis. Reassess for disposition.  ----------------------------------------- 2:14 PM on 07/09/2016 -----------------------------------------  Patient reports that her facial numbness has resolved with treatment of her migraine headache and I suspect that this was a complicated migraine. MRI of her brain is normal, stable, unchanged from prior, shows no evidence of CVA. MRI of the cervical spine shows C4/5 foraminal stenosis as well as uncovertebral ridging and I suspect the symptoms in the right arm are secondary to a cervical radiculopathy. Originally her strength exam was limited by pain in the right arm however she has 5 out of 5 strength when you encourage her to give good effort. CBC and CMP are generally unremarkable. Urinalysis with multiple leukocytes however also many squamous cells, I suspect it is contaminated, the patient has no urinary complaints, we'll send culture and wait to treat. We discussed [facility] return precautions and need for close PCP follow-up, we'll also discharge her with a phone number  for [facility] neurosurgery for follow-up. She is comfortable with the discharge plan. DC home.  Clinical Course  Value Comment By Time  MR Cervical Spine Wo Contrast (Reviewed) Gayla Doss, MD 09/29 1413     ____________________________________________   FINAL CLINICAL IMPRESSION(S) / ED DIAGNOSES  Final diagnoses:  Numbness and tingling of right arm  Complicated migraine  Cervical radiculopathy      NEW MEDICATIONS STARTED DURING THIS VISIT:  New Prescriptions   No medications on  file     Note:  This document was prepared using Dragon voice recognition software and may include unintentional dictation errors.    Gayla Doss, MD 07/09/16 1418    Gayla Doss, MD 07/09/16 1420    Gayla Doss, MD 07/09/16 [phone]
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
## 2016-07-11 LAB — URINE CULTURE: SPECIAL REQUESTS: NORMAL

## 2016-08-11 ENCOUNTER — Encounter: Payer: Self-pay | Admitting: Family Medicine

## 2016-08-11 ENCOUNTER — Ambulatory Visit (INDEPENDENT_AMBULATORY_CARE_PROVIDER_SITE_OTHER): Payer: 59 | Admitting: Family Medicine

## 2016-08-11 VITALS — BP 104/62 | HR 53 | Temp 97.9°F | Ht 65.75 in | Wt 157.5 lb

## 2016-08-11 DIAGNOSIS — Z0000 Encounter for general adult medical examination without abnormal findings: Secondary | ICD-10-CM

## 2016-08-11 DIAGNOSIS — Z01419 Encounter for gynecological examination (general) (routine) without abnormal findings: Secondary | ICD-10-CM

## 2016-08-11 LAB — LIPID PANEL
CHOLESTEROL: 229 mg/dL — AB (ref 0–200)
HDL: 89.2 mg/dL (ref >39.00)
LDL Cholesterol: 124 mg/dL — ABNORMAL HIGH (ref 0–99)
NONHDL: 139.45
Total CHOL/HDL Ratio: 3
Triglycerides: 78 mg/dL (ref 0.0–149.0)
VLDL: 15.6 mg/dL (ref 0.0–40.0)

## 2016-08-11 LAB — COMPREHENSIVE METABOLIC PANEL
ALBUMIN: 4.4 g/dL (ref 3.5–5.2)
ALK PHOS: 59 U/L (ref 39–117)
ALT: 15 U/L (ref 0–35)
AST: 16 U/L (ref 0–37)
BILIRUBIN TOTAL: 0.7 mg/dL (ref 0.2–1.2)
BUN: 13 mg/dL (ref 6–23)
CO2: 28 mEq/L (ref 19–32)
Calcium: 9.4 mg/dL (ref 8.4–10.5)
Chloride: 105 mEq/L (ref 96–112)
Creatinine, Ser: 0.77 mg/dL (ref 0.40–1.20)
GFR: 90.2 mL/min (ref >60.00)
GLUCOSE: 85 mg/dL (ref 70–99)
POTASSIUM: 4.4 meq/L (ref 3.5–5.1)
SODIUM: 138 meq/L (ref 135–145)
TOTAL PROTEIN: 7.2 g/dL (ref 6.0–8.3)

## 2016-08-11 LAB — CBC WITH DIFFERENTIAL/PLATELET
BASOS ABS: 0 10*3/uL (ref 0.0–0.1)
Basophils Relative: 0.5 % (ref 0.0–3.0)
EOS ABS: 0 10*3/uL (ref 0.0–0.7)
Eosinophils Relative: 0.9 % (ref 0.0–5.0)
HCT: 39.8 % (ref 36.0–46.0)
Hemoglobin: 13.5 g/dL (ref 12.0–15.0)
LYMPHS ABS: 1.8 10*3/uL (ref 0.7–4.0)
Lymphocytes Relative: 35.2 % (ref 12.0–46.0)
MCHC: 33.9 g/dL (ref 30.0–36.0)
MCV: 91.2 fl (ref 78.0–100.0)
MONOS PCT: 6.3 % (ref 3.0–12.0)
Monocytes Absolute: 0.3 10*3/uL (ref 0.1–1.0)
NEUTROS ABS: 2.9 10*3/uL (ref 1.4–7.7)
NEUTROS PCT: 57.1 % (ref 43.0–77.0)
PLATELETS: 213 10*3/uL (ref 150.0–400.0)
RBC: 4.36 Mil/uL (ref 3.87–5.11)
RDW: 13.1 % (ref 11.5–15.5)
WBC: 5.1 10*3/uL (ref 4.0–10.5)

## 2016-08-11 LAB — TSH: TSH: 0.75 u[IU]/mL (ref 0.35–4.50)

## 2016-08-11 NOTE — Progress Notes (Signed)
Subjective:   Patient ID: Danielle Schneider, female    DOB: 06/23/1981, 35 y.o.   MRN: [id number]  Danielle Bentmanda M Fare is a pleasant 35 y.o. year old female who presents to clinic today with Annual Exam and bulging disc  on 08/11/2016  HPI:  Has GYN- per pt, pap smear UTD.  Went to ER on 9/29- diagnosed with a bulding disc.  Going to see a Landchiropractor. Having numbness in her right arm- refusing neurosurgery at this point.  No current outpatient prescriptions on file prior to visit.   No current facility-administered medications on file prior to visit.     Allergies  Allergen Reactions  . Vicodin [Hydrocodone-Acetaminophen] Other (See Comments)    Nausea, dizziness, cold sweats    Past Medical History:  Diagnosis Date  . Abnormal Pap smear   . Anal sphincter incontinence after fourth degree tear   resolved as of 2012  . History of nephrolithiasis 02/2011  . Panic attack    history of    Past Surgical History:  Procedure Laterality Date  . CESAREAN SECTION    . post partum sterilization    . rib cage  2006   rib cage bone removal  . SHOULDER OPEN ROTATOR CUFF REPAIR  2004  . SHOULDER SURGERY    . THORACIC OUTLET SURGERY      Family History  Problem Relation Age of Onset  . Heart disease Father     MI in 6640s    Social History   Social History  . Marital status: Married    Spouse name: N/A  . Number of children: N/A  . Years of education: N/A   Occupational History  . Not on file.   Social History Main Topics  . Smoking status: Former Smoker    Quit date: 04/16/2008  . Smokeless tobacco: Never Used  . Alcohol use Yes     Comment: occasionally  . Drug use: No  . Sexual activity: Yes    Partners: Male    Birth control/ protection: Surgical   Other Topics Concern  . Not on file   Social History Narrative  . No narrative on file   The PMH, PSH, Social History, Family History, Medications, and allergies [facility], and have been updated if  relevant.   Review of Systems  Constitutional: Negative.   Respiratory: Negative.   Cardiovascular: Negative.   Gastrointestinal: Negative.   Endocrine: Negative.   Genitourinary: Negative.   Musculoskeletal: Negative.   Allergic/Immunologic: Negative.   Neurological: Positive for numbness.  Hematological: Negative.   Psychiatric/Behavioral: Negative.   All other systems reviewed and are negative.      Objective:    BP 104/62   Pulse (!) 53   Temp 97.9 F (36.6 C) (Oral)   Ht 5' 5.75" (1.67 m)   Wt 157 lb 8 oz (71.4 kg)   SpO2 98%   BMI 25.61 kg/m    Physical Exam   General:  Well-developed,well-nourished,in no acute distress; alert,appropriate and cooperative throughout examination Head:  normocephalic and atraumatic.   Eyes:  vision grossly intact, pupils equal, pupils round, and pupils reactive to light.   Ears:  R ear normal and L ear normal.   Nose:  no external deformity.   Mouth:  good dentition.   Neck:  No deformities, masses, or tenderness noted. Breasts:  No mass, nodules, thickening, tenderness, bulging, retraction, inflamation, nipple discharge or skin changes noted.   Lungs:  Normal respiratory effort, chest  expands symmetrically. Lungs are clear to auscultation, no crackles or wheezes. Heart:  Normal rate and regular rhythm. S1 and S2 normal without gallop, murmur, click, rub or other extra sounds. Abdomen:  Bowel sounds positive,abdomen soft and non-tender without masses, organomegaly or hernias noted. Msk:  No deformity or scoliosis noted of thoracic or lumbar spine.   Extremities:  No clubbing, cyanosis, edema, or deformity noted with normal full range of motion of all joints.   Neurologic:  alert & oriented X3 and gait normal.   Skin:  Intact without suspicious lesions or rashes Cervical Nodes:  No lymphadenopathy noted Axillary Nodes:  No palpable lymphadenopathy Psych:  Cognition and judgment appear intact. Alert and cooperative with normal  attention span and concentration. No apparent delusions, illusions, hallucinations       Assessment & Plan:   Well woman exam No Follow-up on file.

## 2016-08-11 NOTE — Assessment & Plan Note (Signed)
Reviewed preventive care protocols, scheduled due services, and updated immunizations Discussed nutrition, exercise, diet, and healthy lifestyle.  

## 2016-08-11 NOTE — Patient Instructions (Signed)
Great to see you. I will call you with your lab results.   

## 2016-08-11 NOTE — Addendum Note (Signed)
Addended by: Alvina ChouWALSH, TERRI J on: 08/11/2016 02:57 PM   Modules accepted: Orders

## 2016-08-11 NOTE — Progress Notes (Signed)
Pre visit review using our clinic review tool, if applicable. No additional management support is needed unless otherwise documented below in the visit note. 

## 2016-08-12 ENCOUNTER — Encounter: Payer: Self-pay | Admitting: *Deleted

## 2016-08-24 ENCOUNTER — Telehealth: Payer: Self-pay | Admitting: Family Medicine

## 2016-08-24 NOTE — Telephone Encounter (Signed)
Pt is calling to find out when biometric screening was mailed to her. She is wanting to know when to expect it, says she brought it in at her visit on 08/11/16 Pt number is [phone] Thanks

## 2016-08-24 NOTE — Telephone Encounter (Signed)
Spoke to pt and informed her that the form is still available for pickup at the front desk. Results letter was mailed but form is at front desk

## 2016-11-12 ENCOUNTER — Telehealth: Payer: Self-pay

## 2016-11-12 NOTE — Telephone Encounter (Signed)
Pt wanted to know if had record of MMR, varicella or Tdap; advised I do not and pt needs for new employment; explained if cannot find previous immunization record could get titers for MMR and varicella. Pt s new employer already told her that and they can draw test.

## 2017-04-03 IMAGING — CR DG CHEST 2V
1 series · 2 of 2 positions shown · non-contrast
Comparison: PA and lateral chest 01/22/2014.

CLINICAL DATA: Right arm and jaw tingling beginning this morning.
Numbness for 1 week.

EXAM:
CHEST  2 VIEW

[Series 1: dg chest 2 view · 0.14mm/px · 2 of 2 slices shown]
[im 1/2]
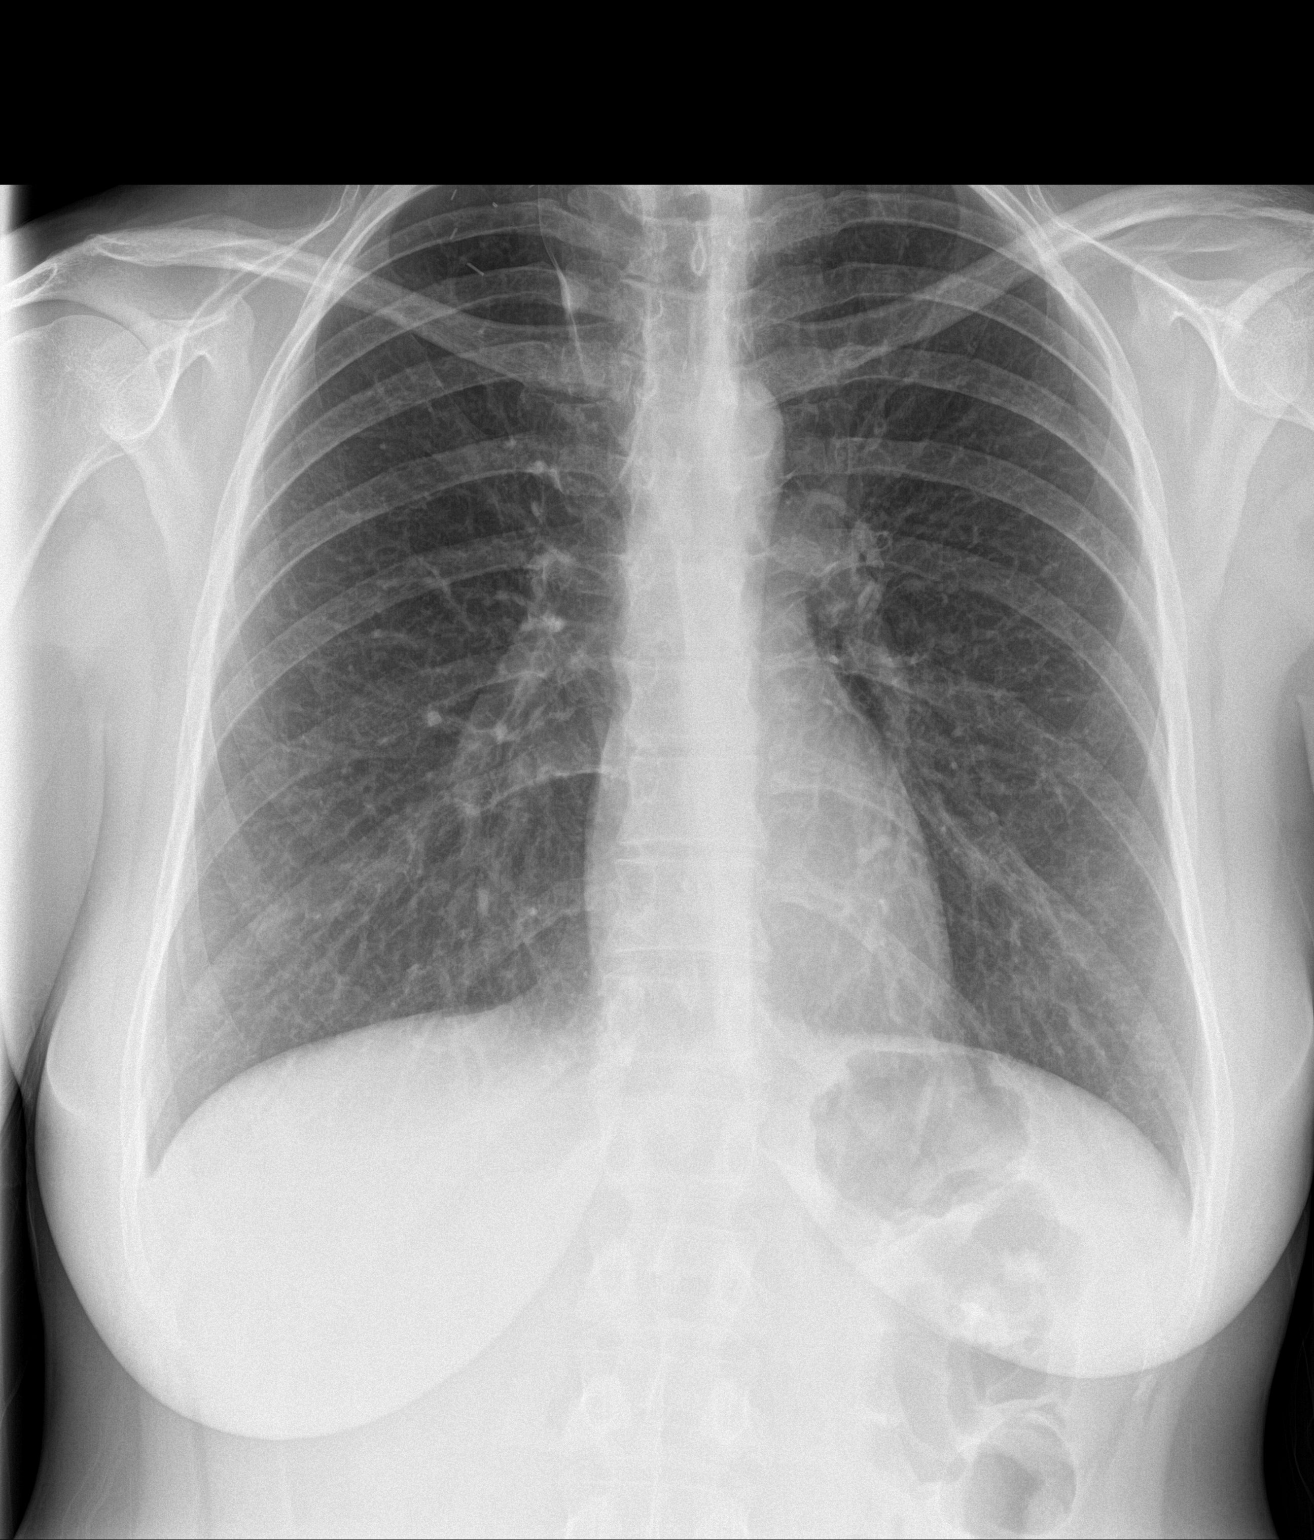
[im 2/2]
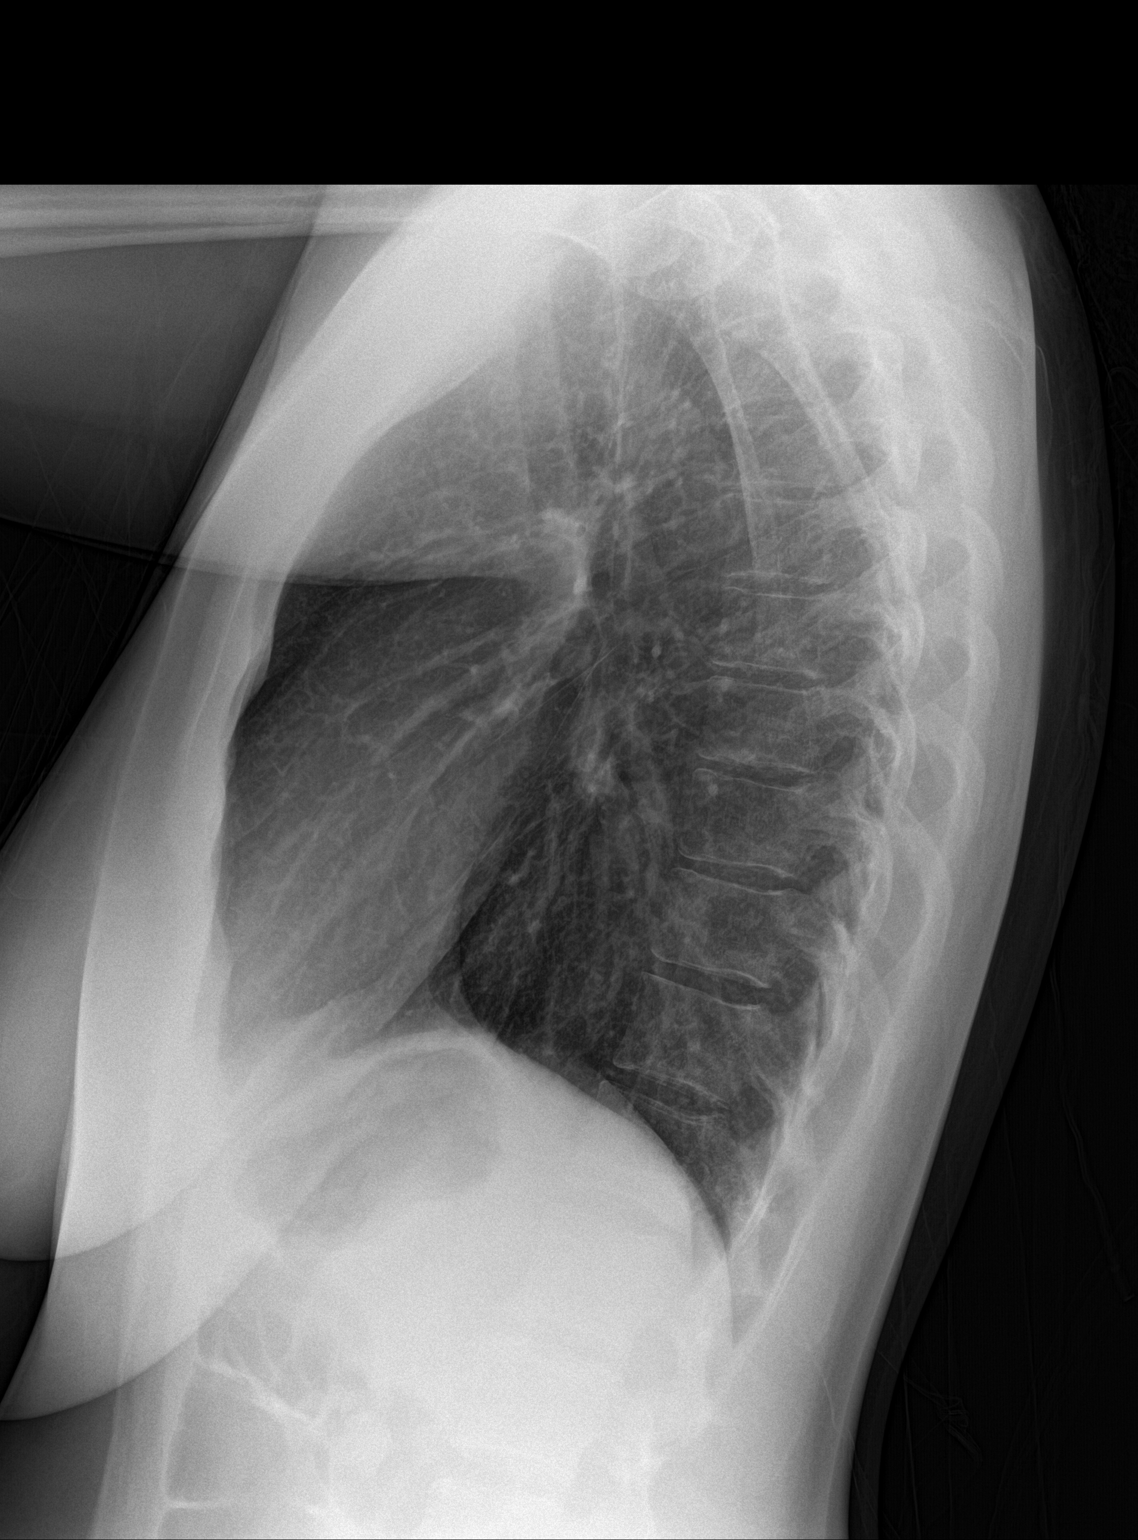

[2 of 2 positions shown; findings below may reference images not displayed]

FINDINGS: The lungs are clear. Heart size is normal. No pneumothorax or
pleural effusion. Surgical clips in the right lung apex are
unchanged. No focal bony abnormality.
IMPRESSION: No acute disease.

## 2017-04-07 ENCOUNTER — Encounter: Payer: Self-pay | Admitting: Family Medicine

## 2017-04-07 ENCOUNTER — Ambulatory Visit (INDEPENDENT_AMBULATORY_CARE_PROVIDER_SITE_OTHER): Payer: BLUE CROSS/BLUE SHIELD | Admitting: Family Medicine

## 2017-04-07 DIAGNOSIS — R55 Syncope and collapse: Secondary | ICD-10-CM | POA: Diagnosis not present

## 2017-04-07 DIAGNOSIS — R5381 Other malaise: Secondary | ICD-10-CM | POA: Diagnosis not present

## 2017-04-07 LAB — CBC WITH DIFFERENTIAL/PLATELET
BASOS ABS: 0 10*3/uL (ref 0.0–0.1)
Basophils Relative: 0.7 % (ref 0.0–3.0)
EOS PCT: 1.1 % (ref 0.0–5.0)
Eosinophils Absolute: 0.1 10*3/uL (ref 0.0–0.7)
HEMATOCRIT: 38 % (ref 36.0–46.0)
Hemoglobin: 12.7 g/dL (ref 12.0–15.0)
LYMPHS ABS: 1.8 10*3/uL (ref 0.7–4.0)
LYMPHS PCT: 31.7 % (ref 12.0–46.0)
MCHC: 33.5 g/dL (ref 30.0–36.0)
MCV: 93 fl (ref 78.0–100.0)
MONOS PCT: 8.2 % (ref 3.0–12.0)
Monocytes Absolute: 0.5 10*3/uL (ref 0.1–1.0)
NEUTROS ABS: 3.4 10*3/uL (ref 1.4–7.7)
Neutrophils Relative %: 58.3 % (ref 43.0–77.0)
PLATELETS: 211 10*3/uL (ref 150.0–400.0)
RBC: 4.09 Mil/uL (ref 3.87–5.11)
RDW: 13.2 % (ref 11.5–15.5)
WBC: 5.8 10*3/uL (ref 4.0–10.5)

## 2017-04-07 LAB — COMPREHENSIVE METABOLIC PANEL
ALK PHOS: 60 U/L (ref 39–117)
ALT: 13 U/L (ref 0–35)
AST: 13 U/L (ref 0–37)
Albumin: 4.1 g/dL (ref 3.5–5.2)
BILIRUBIN TOTAL: 0.6 mg/dL (ref 0.2–1.2)
BUN: 11 mg/dL (ref 6–23)
CALCIUM: 9.3 mg/dL (ref 8.4–10.5)
CO2: 28 mEq/L (ref 19–32)
Chloride: 106 mEq/L (ref 96–112)
Creatinine, Ser: 0.79 mg/dL (ref 0.40–1.20)
GFR: 87.25 mL/min (ref >60.00)
Glucose, Bld: 83 mg/dL (ref 70–99)
POTASSIUM: 4.2 meq/L (ref 3.5–5.1)
Sodium: 140 mEq/L (ref 135–145)
TOTAL PROTEIN: 6.3 g/dL (ref 6.0–8.3)

## 2017-04-07 LAB — VITAMIN B12: VITAMIN B 12: 199 pg/mL — AB (ref 211–911)

## 2017-04-07 LAB — BRAIN NATRIURETIC PEPTIDE: Pro B Natriuretic peptide (BNP): 36 pg/mL (ref 0.0–100.0)

## 2017-04-07 LAB — TSH: TSH: 1.05 u[IU]/mL (ref 0.35–4.50)

## 2017-04-07 LAB — VITAMIN D 25 HYDROXY (VIT D DEFICIENCY, FRACTURES): VITD: 26.75 ng/mL — AB (ref 30.00–100.00)

## 2017-04-07 LAB — HCG, QUANTITATIVE, PREGNANCY: Quantitative HCG: 0.28 m[IU]/mL

## 2017-04-07 NOTE — Progress Notes (Signed)
Pre visit review using our clinic review tool, if applicable. No additional management support is needed unless otherwise documented below in the visit note. 

## 2017-04-07 NOTE — Progress Notes (Signed)
Subjective:   Patient ID: Danielle Schneider, female    DOB: 10/07/1981, 36 y.o.   MRN: [id number]  Danielle Bentmanda M Blecher is a pleasant 36 y.o. year old female who presents to clinic today with Fatigue ("exhausted, HA "different from migraine HA" almost "passed out yesterday" at work. )  on 04/07/2017  HPI:  She is a dialysis nurse. Yesterday while at work, she abruptly felt dizzy and as if she would pass out.  She sat down- checked her BP and it was 109/80, pulse was 67.  Did not check her blood sugar.  She did feel a bit sweaty and nauseated and had a headache.  Those symptoms resolved within 10 minutes but today she feels "exhausted."  She often feels drained when she has a migraine but this is "worse."  She has been on a new weight loss and migraine prevention diet and has been more fatigued for the past 2 weeks.  No CP or SOB.    No current outpatient prescriptions on file prior to visit.   No current facility-administered medications on file prior to visit.     Allergies  Allergen Reactions  . Vicodin [Hydrocodone-Acetaminophen] Other (See Comments)    Nausea, dizziness, cold sweats    Past Medical History:  Diagnosis Date  . Abnormal Pap smear   . Anal sphincter incontinence after fourth degree tear   resolved as of 2012  . History of nephrolithiasis 02/2011  . Panic attack    history of    Past Surgical History:  Procedure Laterality Date  . CESAREAN SECTION    . post partum sterilization    . rib cage  2006   rib cage bone removal  . SHOULDER OPEN ROTATOR CUFF REPAIR  2004  . SHOULDER SURGERY    . THORACIC OUTLET SURGERY      Family History  Problem Relation Age of Onset  . Heart disease Father        MI in 6740s    Social History   Social History  . Marital status: Married    Spouse name: N/A  . Number of children: N/A  . Years of education: N/A   Occupational History  . Not on file.   Social History Main Topics  . Smoking status: Former Smoker    Quit date: 04/16/2008  . Smokeless tobacco: Never Used  . Alcohol use Yes     Comment: occasionally  . Drug use: No  . Sexual activity: Yes    Partners: Male    Birth control/ protection: Surgical   Other Topics Concern  . Not on file   Social History Narrative  . No narrative on file   The PMH, PSH, Social History, Family History, Medications, and allergies [facility], and have been updated if relevant.   Review of Systems  Constitutional: Positive for diaphoresis and fatigue.  HENT: Negative.   Eyes: Negative.   Respiratory: Negative.   Cardiovascular: Negative.   Gastrointestinal: Negative.   Endocrine: Negative.   Genitourinary: Negative.   Musculoskeletal: Negative.   Skin: Negative.   Allergic/Immunologic: Negative.   Neurological: Positive for dizziness, light-headedness and headaches. Negative for tremors, seizures, syncope, facial asymmetry, speech difficulty, weakness and numbness.  Hematological: Negative.   Psychiatric/Behavioral: Negative.   All other systems reviewed and are negative.      Objective:    Ht 5' 5.5" (1.664 m)   Wt 168 lb 8 oz (76.4 kg)   LMP 03/25/2017   BMI  27.61 kg/m    Physical Exam    General:  Well-developed,well-nourished,in no acute distress; alert,appropriate and cooperative throughout examination Head:  normocephalic and atraumatic.   Eyes:  vision grossly intact, PERRL Ears:  R ear normal and L ear normal externally, TMs clear bilaterally Nose:  no external deformity.   Mouth:  good dentition.   Neck:  No deformities, masses, or tenderness noted.   Lungs:  Normal respiratory effort, chest expands symmetrically. Lungs are clear to auscultation, no crackles or wheezes. Heart:  Normal rate and regular rhythm. S1 and S2 normal without gallop, murmur, click, rub or other extra sounds. Abdomen:  Bowel sounds positive,abdomen soft and non-tender without masses, organomegaly or hernias noted. Msk:  No deformity  or scoliosis noted of thoracic or lumbar spine.   Extremities:  No clubbing, cyanosis, edema, or deformity noted with normal full range of motion of all joints.   Neurologic:  alert & oriented X3 and gait normal.   Skin:  Intact without suspicious lesions or rashes Psych:  Cognition and judgment appear intact. Alert and cooperative with normal attention span and concentration. No apparent delusions, illusions, hallucinations      Assessment & Plan:   No diagnosis found. No Follow-up on file.

## 2017-04-07 NOTE — Assessment & Plan Note (Signed)
New- resolved but remains fatigued. EKG reassuring- unchanged from prio. ? If fatigue is due to her new diet. Will check labs today as well. Possible that her blood sugar was low and that led to her pre syncopal episode.  Orders Placed This Encounter  Procedures  . CBC with Differential/Platelet  . Comprehensive metabolic panel  . TSH  . Vitamin B12  . Vitamin D, 25-hydroxy  . hCG, quantitative, pregnancy  . Brain natriuretic peptide  . EKG 12-Lead   .

## 2017-04-11 ENCOUNTER — Encounter: Payer: Self-pay | Admitting: Family Medicine

## 2017-04-12 ENCOUNTER — Other Ambulatory Visit: Payer: Self-pay | Admitting: Family Medicine

## 2017-04-12 DIAGNOSIS — R0683 Snoring: Secondary | ICD-10-CM

## 2017-04-14 ENCOUNTER — Encounter: Payer: Self-pay | Admitting: Family Medicine

## 2017-06-10 ENCOUNTER — Institutional Professional Consult (permissible substitution): Payer: BLUE CROSS/BLUE SHIELD | Admitting: Pulmonary Disease

## 2018-02-07 ENCOUNTER — Ambulatory Visit (INDEPENDENT_AMBULATORY_CARE_PROVIDER_SITE_OTHER): Payer: BLUE CROSS/BLUE SHIELD | Admitting: Obstetrics & Gynecology

## 2018-02-07 ENCOUNTER — Encounter: Payer: Self-pay | Admitting: Obstetrics & Gynecology

## 2018-02-07 VITALS — BP 123/77 | HR 58 | Ht 66.0 in | Wt 165.0 lb

## 2018-02-07 DIAGNOSIS — Z01419 Encounter for gynecological examination (general) (routine) without abnormal findings: Secondary | ICD-10-CM

## 2018-02-07 DIAGNOSIS — Z1151 Encounter for screening for human papillomavirus (HPV): Secondary | ICD-10-CM | POA: Diagnosis not present

## 2018-02-07 DIAGNOSIS — Z124 Encounter for screening for malignant neoplasm of cervix: Secondary | ICD-10-CM | POA: Diagnosis not present

## 2018-02-07 NOTE — Progress Notes (Signed)
Subjective:    Danielle Schneider is a 37 y.o. married P3 (17, 49, and 21 yo kids)female who presents for an annual exam. The patient has no complaints today. The patient is sexually active. GYN screening history: last pap: was normal. The patient wears seatbelts: yes. The patient participates in regular exercise: yes. Has the patient ever been transfused or tattooed?: yes. The patient reports that there is not domestic violence in her life.   Menstrual History: OB History    Gravida  3   Para  3   Term  3   Preterm  0   AB  0   Living  3     SAB  0   TAB  0   Ectopic  0   Multiple  0   Live Births  105           Menarche age: 11 Patient's last menstrual period was 01/22/2018.    The following portions of the patient's history were reviewed and updated as appropriate: allergies, current medications, past family history, past medical history, past social history, past surgical history and problem list.  Review of Systems Pertinent items are noted in HPI.   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)   Objective:    BP 123/77   Pulse (!) 58   Ht _0  (1.676 m)   Wt 165 lb (74.8 kg)   LMP 01/22/2018   BMI 26.63 kg/m   General Appearance:    Alert, cooperative, no distress, appears stated age  Head:    Normocephalic, without obvious abnormality, atraumatic  Eyes:    PERRL, conjunctiva/corneas clear, EOM's intact, fundi    benign, both eyes  Ears:    Normal TM's and external ear canals, both ears  Nose:   Nares normal, septum midline, mucosa normal, no drainage    or sinus tenderness  Throat:   Lips, mucosa, and tongue normal; teeth and gums normal  Neck:   Supple, symmetrical, trachea midline, no adenopathy;    thyroid:  no enlargement/tenderness/nodules; no carotid   bruit or JVD  Back:     Symmetric, no curvature, ROM  normal, no CVA tenderness  Lungs:     Clear to auscultation bilaterally, respirations unlabored  Chest Wall:    No tenderness or deformity   Heart:    Regular rate and rhythm, S1 and S2 normal, no murmur, rub   or gallop  Breast Exam:    No tenderness, masses, or nipple abnormality  Abdomen:     Soft, non-tender, bowel sounds active all four quadrants,    no masses, no organomegaly  Genitalia:    Normal female without lesion, discharge or tenderness, normal size and shape, anteverted, mobile, non-tender, normal adnexal exam     Extremities:   Extremities normal, atraumatic, no cyanosis or edema  Pulses:   2+ and symmetric all extremities  Skin:   Skin color, texture, turgor normal, no rashes or lesions  Lymph nodes:   Cervical, supraclavicular, and axillary nodes normal  Neurologic:   CNII-XII intact, normal strength, sensation and reflexes    throughout  .    Assessment:    Healthy female exam.    Plan:     Thin prep Pap smear. with cotesting Genetic testing for strong FH of cancer

## 2018-02-09 LAB — CYTOLOGY - PAP
Diagnosis: NEGATIVE
HPV: NOT DETECTED

## 2018-03-01 ENCOUNTER — Encounter: Payer: Self-pay | Admitting: Obstetrics & Gynecology

## 2018-03-02 ENCOUNTER — Encounter: Payer: Self-pay | Admitting: Genetic Counselor

## 2018-03-02 ENCOUNTER — Telehealth: Payer: Self-pay | Admitting: Genetic Counselor

## 2018-03-02 ENCOUNTER — Encounter: Payer: Self-pay | Admitting: *Deleted

## 2018-03-02 DIAGNOSIS — Z1379 Encounter for other screening for genetic and chromosomal anomalies: Secondary | ICD-10-CM

## 2018-03-02 NOTE — Telephone Encounter (Signed)
Genetic counseling referral received from Dr. Marice Potter for fhx of cancer. Pt has been scheduled to see Maylon Cos on 7/10 at 10am. Pt aware to arrive 30 minutes early. Letter mailed.

## 2018-04-19 ENCOUNTER — Inpatient Hospital Stay: Payer: BLUE CROSS/BLUE SHIELD | Attending: Genetic Counselor | Admitting: Genetic Counselor

## 2018-04-19 ENCOUNTER — Inpatient Hospital Stay: Payer: BLUE CROSS/BLUE SHIELD

## 2018-04-19 ENCOUNTER — Encounter: Payer: Self-pay | Admitting: Genetic Counselor

## 2018-04-19 DIAGNOSIS — Z800 Family history of malignant neoplasm of digestive organs: Secondary | ICD-10-CM

## 2018-04-19 DIAGNOSIS — Z803 Family history of malignant neoplasm of breast: Secondary | ICD-10-CM

## 2018-04-19 DIAGNOSIS — Z1379 Encounter for other screening for genetic and chromosomal anomalies: Secondary | ICD-10-CM

## 2018-04-19 NOTE — Progress Notes (Signed)
REFERRING PROVIDER: Emily Filbert, MD [address], [place] [address]  PRIMARY PROVIDER:  Lucille Passy, MD  PRIMARY REASON FOR VISIT:  1. Family history of breast cancer   2. Family history of colon cancer      HISTORY OF PRESENT ILLNESS:   Danielle Schneider, a 37 y.o. female, was seen for a [facility] cancer genetics consultation at the request of Dr. Hulan Schneider due to a family history of cancer, and a VUS identified in [facility].  Danielle Schneider presents to clinic today to discuss the possibility of a hereditary predisposition to cancer, genetic testing, and to further clarify her future cancer risks, as well as potential cancer risks for family members.   Danielle Schneider is a 37 y.o. female with no personal history of cancer.  Her sister underwent genetic testing and, by report, has a BRCA1 mutation.  Danielle Schneider does not have a copy of this report.  Danielle Schneider underwent genetic testing through [facility] Common hereditary cancer panel at her GYN office. There were no deleterious mutations identified.  A variant of uncertain significance was identified in CD[facility].  CANCER HISTORY:   No history exists.     HORMONAL RISK FACTORS:  Menarche was at age 24.  First live birth at age 3.  OCP use for approximately 5-6 years.  Ovaries intact: yes.  Hysterectomy: no.  Menopausal status: premenopausal.  HRT use: 0 years. Colonoscopy: no; not examined. Mammogram within the last year: no. Number of breast biopsies: 0. Up to date with pelvic exams:  yes. Any excessive radiation exposure in the past:  no  Past Medical History:  Diagnosis Date  . Abnormal Pap smear   . Anal sphincter incontinence after fourth degree tear   resolved as of 2012  . Family history of breast cancer   . History of nephrolithiasis 02/2011  . Panic attack    history of    Past Surgical History:  Procedure Laterality Date  . CESAREAN SECTION    . post partum sterilization    . rib cage  2006   rib cage bone removal  .  SHOULDER OPEN ROTATOR CUFF REPAIR  2004  . SHOULDER SURGERY    . THORACIC OUTLET SURGERY      Social History   Socioeconomic History  . Marital status: Married    Spouse name: Not on file  . Number of children: Not on file  . Years of education: Not on file  . Highest education level: Not on file  Occupational History  . Not on file  Social Needs  . Financial resource strain: Not on file  . Food insecurity:    Worry: Not on file    Inability: Not on file  . Transportation needs:    Medical: Not on file    Non-medical: Not on file  Tobacco Use  . Smoking status: Former Smoker    Last attempt to quit: 04/16/2008    Years since quitting: 10.0  . Smokeless tobacco: Never Used  Substance and Sexual Activity  . Alcohol use: Yes    Comment: occasionally  . Drug use: No  . Sexual activity: Yes    Partners: Male    Birth control/protection: Surgical  Lifestyle  . Physical activity:    Days per week: Not on file    Minutes per session: Not on file  . Stress: Not on file  Relationships  . Social connections:    Talks on phone: Not on file  Gets together: Not on file    Attends religious service: Not on file    Active member of club or organization: Not on file    Attends meetings of clubs or organizations: Not on file    Relationship status: Not on file  Other Topics Concern  . Not on file  Social History Narrative  . Not on file     FAMILY HISTORY:  We obtained a detailed, 4-generation family history.  Significant diagnoses are listed below: Family History  Problem Relation Age of Onset  . Heart disease Father        MI in 81s  . Hypertension Father   . Diabetes Father   . Stroke Father   . Ovarian cysts Sister   . Pancreatitis Maternal Grandfather   . Cancer Maternal Grandfather        "all over"  . Macular degeneration Paternal Grandmother   . Breast cancer Paternal Grandmother        dx before 53  . Tuberculosis Paternal Grandfather   . Esophageal  cancer Paternal Grandfather   . Diabetes Paternal Grandfather   . BRCA 1/2 Sister        BRCA1 + (by report)  . Cervical cancer Sister   . Breast cancer Other        MGF's mother  . Colon cancer Cousin        pat first cousin dx in his 81s    The patient has two sons and a daughter who are all cancer free.  She has three sisters.  One sister has cervical cancer due to HPV.  One sister underwent genetic testing after finding a lump in her breast and is reported to have a BRCA1 mutation.  No other family members have undergone genetic testing.  Both parents are living.  The patient's mother has three brothers who are cancer free.  Both of her parents are deceased.  Her father underwent surgery for a hernia and was found to have cancer "all over".  His mother had breast cancer.  The patient's father has one brother and two sisters.  None have cancer.  His mother had unilateral breast cancer under the age of 56.  His father had TB and throat cancer.  Danielle Schneider is unaware of previous family history of genetic testing for hereditary cancer risks. Patient's maternal ancestors are of [place], [place] and [place] descent, and paternal ancestors are of [place] descent. There is no reported Ashkenazi Jewish ancestry. There is no known consanguinity.  GENETIC COUNSELING ASSESSMENT: Danielle Schneider is a 37 y.o. female with a family history of breast cancer and a reported BRCA1 mutation which is somewhat suggestive of a hereditary cancer syndrome and predisposition to cancer. We, therefore, discussed and recommended the following at today's visit.   DISCUSSION: We discussed that about 5-10% of breast cancer is hereditary with most cases due to BRCA mutations.  There are other genes associated with hereditary breast syndromes, including ATM, CHEK2 and PALB2.  We also discussed genetic testing, including the appropriate family members to test, the process of testing, insurance coverage and turn-around-time for  results. We discussed the implications of a negative, positive and/or variant of uncertain significant result.   The patient underwent genetic testing with [facility]'s common hereditary cancer panel.  The Hereditary Gene Panel offered by [facility] includes sequencing and/or deletion duplication testing of the following 47 genes: APC, ATM, AXIN2, BARD1, BMPR1A, BRCA1, BRCA2, BRIP1, CDH1, CDK4, CDKN2A (p14ARF), CDKN2A (p16INK4a), CHEK2, CTNNA1, DICER1, EPCAM (  Deletion/duplication testing only), GREM1 (promoter region deletion/duplication testing only), KIT, MEN1, MLH1, MSH2, MSH3, MSH6, MUTYH, NBN, NF1, NHTL1, PALB2, PDGFRA, PMS2, POLD1, POLE, PTEN, RAD50, RAD51C, RAD51D, SDHB, SDHC, SDHD, SMAD4, SMARCA4. STK11, TP53, TSC1, TSC2, and VHL.  The following genes were evaluated for sequence changes only: SDHA and HOXB13 c.251G>A variant only.  We discussed that her testing was normal and that no deleterious mutations were identified.  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 At this time, it is unknown if this variant is associated with increased cancer risk or if this is a normal finding, but most variants such as this get reclassified to being inconsequential. It should not be used to make medical management decisions. With time, we suspect the lab will determine the significance of this variant, if any. If we do learn more about it, we will try to contact Ms. Barstow to discuss it further. However, it is important to stay in touch with [place] periodically and keep the address and phone number up to date.   Based on the patient's personal and family history, statistical models (Tyrer Cusik)  and literature data were used to estimate her risk of developing breast cancer. These estimate her lifetime risk of developing breast cancer to be approximately 7.5%. This estimation does not take into account any genetic testing results.  The patient's lifetime  breast cancer risk is a preliminary estimate based on available information using one of several models endorsed by the [place] (ACS). The ACS recommends consideration of breast MRI screening as an adjunct to mammography for patients at high risk (defined as 20% or greater lifetime risk). A more detailed breast cancer risk assessment can be considered, if clinically indicated.     CANCER SCREENING RECOMMENDATIONS:  This normal result is reassuring and indicates that Ms. Caton does not likely have an increased risk of cancer due to a mutation in one of these genes.  We, therefore, recommended  Ms. Mcdermid continue to follow the cancer screening guidelines provided by her primary healthcare providers.   An individual's cancer risk and medical management are not determined by genetic test results alone. Overall cancer risk assessment incorporates additional factors, including personal medical history, family history, and any available genetic information that may result in a personalized plan for cancer prevention and surveillance.  RECOMMENDATIONS FOR FAMILY MEMBERS:  Women in this family might be at some increased risk of developing cancer, over the general population risk, simply due to the family history of cancer.  We recommended women in this family have a yearly mammogram beginning at age 38, or 67 years younger than the earliest onset of cancer, an annual clinical breast exam, and perform monthly breast self-exams. Women in this family should also have a gynecological exam as recommended by their primary provider. All family members should have a colonoscopy by age 13.  Based on Ms. Fitchett's family history, we recommended her sisters, have genetic counseling and testing, based on one sister's genetic testing.  We also recommend that Ms. Terra attempt to get a copy of her sister's report for confirmation of the diagnosis. Ms. Persaud will let us know if we can be of any assistance in  coordinating genetic counseling and/or testing for this family member.   FOLLOW-UP: Lastly, we discussed with Ms. Kia that cancer genetics is a rapidly advancing field and it is possible that new genetic tests will be appropriate for her and/or her family members in  the future. We encouraged her to remain in contact with cancer genetics on an annual basis so we can update her personal and family histories and let her know of advances in cancer genetics that may benefit this family.   Our contact number was provided. Ms. Picinich questions were answered to her satisfaction, and she knows she is welcome to call us at anytime with additional questions or concerns.   Roma Kayser, MS, [facility] Certified Genetic Counselor [email or web address]

## 2018-11-29 ENCOUNTER — Encounter: Payer: Self-pay | Admitting: Radiology

## 2019-02-26 ENCOUNTER — Other Ambulatory Visit: Payer: Self-pay

## 2019-02-26 ENCOUNTER — Encounter: Payer: Self-pay | Admitting: Obstetrics & Gynecology

## 2019-02-26 ENCOUNTER — Institutional Professional Consult (permissible substitution): Payer: BLUE CROSS/BLUE SHIELD | Admitting: Obstetrics & Gynecology

## 2019-02-26 ENCOUNTER — Telehealth (INDEPENDENT_AMBULATORY_CARE_PROVIDER_SITE_OTHER): Payer: BLUE CROSS/BLUE SHIELD | Admitting: Obstetrics & Gynecology

## 2019-02-26 DIAGNOSIS — E538 Deficiency of other specified B group vitamins: Secondary | ICD-10-CM

## 2019-02-26 DIAGNOSIS — R6882 Decreased libido: Secondary | ICD-10-CM

## 2019-02-26 DIAGNOSIS — E559 Vitamin D deficiency, unspecified: Secondary | ICD-10-CM | POA: Diagnosis not present

## 2019-02-26 NOTE — Progress Notes (Signed)
I connected with  Danielle Schneider on 02/26/19 at  1:30 PM EDT by telephone and verified that I am speaking with the correct person using two identifiers.   I discussed the limitations, risks, security and privacy concerns of performing an evaluation and management service by telephone and the availability of in person appointments. I also discussed with the patient that there may be a patient responsible charge related to this service. The patient expressed understanding and agreed to proceed.  Scheryl Marten, RN 02/26/2019  1:41 PM

## 2019-02-26 NOTE — Progress Notes (Signed)
    TELEHEALTH GYNECOLOGY VIRTUAL VIDEO VISIT ENCOUNTER NOTE  Provider location: [facility] [place] [facility]   I connected with Danielle Schneider on 02/26/19 at  1:30 PM EDT by WebEx Video Encounter at home and verified that I am speaking with the correct person using two identifiers.   I discussed the limitations, risks, security and privacy concerns of performing an evaluation and management service by telephone and the availability of in person appointments. I also discussed with the patient that there may be a patient responsible charge related to this service. The patient expressed understanding and agreed to proceed.   History:  Danielle Schneider is a 38 y.o. [phone] female being evaluated today for decline in her libido for about 6 months, but always feels like she has had a low sex drive. They have sex about once q 3 months. She has a h/o dyspareunia, but not currently. When she has sex she isn't sure if she is orgasmic. She tried a cream non- prescription with no help. It has progesterone in it.  She denies any abnormal vaginal discharge, bleeding, pelvic pain or other concerns.       Past Medical History:  Diagnosis Date  . Abnormal Pap smear   . Anal sphincter incontinence after fourth degree tear   resolved as of 2012  . Family history of breast cancer   . History of nephrolithiasis 02/2011  . Panic attack    history of   Past Surgical History:  Procedure Laterality Date  . CESAREAN SECTION    . post partum sterilization    . rib cage  2006   rib cage bone removal  . SHOULDER OPEN ROTATOR CUFF REPAIR  2004  . SHOULDER SURGERY    . THORACIC OUTLET SURGERY     The following portions of the patient's history were reviewed and updated as appropriate: allergies, current medications, past family history, past medical history, past social history, past surgical history and problem list.    Review of Systems:  Pertinent items noted in HPI and remainder of  comprehensive ROS otherwise negative. She tested negative for BRCA. She had a BTL in the past.  Physical Exam:   General:  Alert, oriented and cooperative. Patient appears to be in no acute distress.  Mental Status: Normal mood and affect. Normal behavior. Normal judgment and thought content.   Respiratory: Normal respiratory effort, no problems with respiration noted  Rest of physical exam deferred due to type of encounter  Labs and Imaging No results found for this or any previous visit (from the past 336 hour(s)). No results found.     Assessment and Plan:     Decreased libido- discussed topical testosterone, Vyleesi, and Addyi. At this point she would like to try test. I wrote a prescription for 2% to be used QOD Offered sex therapy with counselor in [place].      I discussed the assessment and treatment plan with the patient. The patient was provided an opportunity to ask questions and all were answered. The patient agreed with the plan and demonstrated an understanding of the instructions.   The patient was advised to call back or seek an in-person evaluation/go to the ED if the symptoms worsen or if the condition fails to improve as anticipated.  I provided 15 minutes of face-to-face time during this encounter.   Emily Filbert, MD [facility] for [facility], [place]

## 2019-02-27 ENCOUNTER — Telehealth: Payer: Self-pay

## 2019-02-27 ENCOUNTER — Other Ambulatory Visit: Payer: BLUE CROSS/BLUE SHIELD

## 2019-02-27 DIAGNOSIS — E559 Vitamin D deficiency, unspecified: Secondary | ICD-10-CM

## 2019-02-27 DIAGNOSIS — E538 Deficiency of other specified B group vitamins: Secondary | ICD-10-CM

## 2019-02-27 NOTE — Telephone Encounter (Signed)
Received message from pharmacy. They are requesting direction and strength of medication. Is it a gel or pump?  Thanks

## 2019-02-28 LAB — VITAMIN D 25 HYDROXY (VIT D DEFICIENCY, FRACTURES): Vit D, 25-Hydroxy: 32.6 ng/mL (ref 30.0–100.0)

## 2019-02-28 LAB — VITAMIN B12: Vitamin B-12: 867 pg/mL (ref 232–1245)

## 2019-03-01 ENCOUNTER — Encounter: Payer: Self-pay | Admitting: *Deleted

## 2022-06-15 DIAGNOSIS — Z131 Encounter for screening for diabetes mellitus: Secondary | ICD-10-CM | POA: Diagnosis not present

## 2022-06-15 DIAGNOSIS — Z6825 Body mass index (BMI) 25.0-25.9, adult: Secondary | ICD-10-CM | POA: Diagnosis not present

## 2022-06-15 DIAGNOSIS — E785 Hyperlipidemia, unspecified: Secondary | ICD-10-CM | POA: Diagnosis not present

## 2022-08-12 ENCOUNTER — Other Ambulatory Visit: Payer: Self-pay

## 2022-08-12 MED ORDER — FLUARIX QUADRIVALENT 0.5 ML IM SUSY
PREFILLED_SYRINGE | INTRAMUSCULAR | 0 refills | Status: AC
Start: 2022-08-12 — End: ?
  Filled 2022-08-12: qty 0.5, 1d supply, fill #0

## 2023-08-24 DIAGNOSIS — Z713 Dietary counseling and surveillance: Secondary | ICD-10-CM | POA: Diagnosis not present

## 2023-08-24 DIAGNOSIS — Z131 Encounter for screening for diabetes mellitus: Secondary | ICD-10-CM | POA: Diagnosis not present

## 2023-08-24 DIAGNOSIS — Z6825 Body mass index (BMI) 25.0-25.9, adult: Secondary | ICD-10-CM | POA: Diagnosis not present

## 2023-08-24 DIAGNOSIS — Z13220 Encounter for screening for lipoid disorders: Secondary | ICD-10-CM | POA: Diagnosis not present

## 2024-03-26 ENCOUNTER — Ambulatory Visit: Admitting: General Practice

## 2024-03-26 ENCOUNTER — Encounter: Payer: Self-pay | Admitting: General Practice

## 2024-03-26 VITALS — BP 118/58 | HR 63 | Temp 98.6°F | Ht 66.0 in | Wt 161.0 lb

## 2024-03-26 DIAGNOSIS — Z0000 Encounter for general adult medical examination without abnormal findings: Secondary | ICD-10-CM | POA: Diagnosis not present

## 2024-03-26 DIAGNOSIS — Z803 Family history of malignant neoplasm of breast: Secondary | ICD-10-CM

## 2024-03-26 DIAGNOSIS — F17200 Nicotine dependence, unspecified, uncomplicated: Secondary | ICD-10-CM | POA: Insufficient documentation

## 2024-03-26 DIAGNOSIS — E782 Mixed hyperlipidemia: Secondary | ICD-10-CM | POA: Diagnosis not present

## 2024-03-26 DIAGNOSIS — Z1159 Encounter for screening for other viral diseases: Secondary | ICD-10-CM

## 2024-03-26 DIAGNOSIS — Z1231 Encounter for screening mammogram for malignant neoplasm of breast: Secondary | ICD-10-CM | POA: Diagnosis not present

## 2024-03-26 DIAGNOSIS — Z800 Family history of malignant neoplasm of digestive organs: Secondary | ICD-10-CM

## 2024-03-26 DIAGNOSIS — Z7689 Persons encountering health services in other specified circumstances: Secondary | ICD-10-CM | POA: Insufficient documentation

## 2024-03-26 NOTE — Assessment & Plan Note (Signed)
 Immunizations due; will get tdap at work. Pap smear due; she will schedule with gyn. Mammogram due, orders placed.  Discussed the importance of a healthy diet and regular exercise in order for weight loss, and to reduce the risk of further co-morbidity.  Exam stable. Labs pending.  Follow up in 1 year for repeat physical.

## 2024-03-26 NOTE — Assessment & Plan Note (Signed)
 Repeat lipid panel pending.  Reviewed labs from November, 2024.  Discussed the importance of monitoring diet and exercise.

## 2024-03-26 NOTE — Assessment & Plan Note (Signed)
 Order placed

## 2024-03-26 NOTE — Assessment & Plan Note (Signed)
 Unsure

## 2024-03-26 NOTE — Patient Instructions (Addendum)
 Stop by the lab prior to leaving today. I will notify you of your results once received.   Schedule pap smear.   You have an order for:  []   2D Mammogram  [x]   3D Mammogram  []   Bone Density     Please call for appointment:  [facility]  [address] [place] [address] [phone] [facility] [address] [place], [place] [address] [phone] [facility] [address] Tracey Friday [place], [place] [address] [phone]   Make sure to wear two-piece clothing.  No lotions, powders, or deodorants the day of the appointment. Make sure to bring picture ID and insurance card.  Bring list of medications you are currently taking including any supplements.   Schedule your [facility] screening mammogram through MyChart!   Log into your MyChart account.  Go to 'Visit' (or 'Appointments' if on mobile App) --> Schedule an Appointment  Under 'Select a Reason for Visit' choose the Mammogram Screening option.  Complete the pre-visit questions and select the time and place that best fits your schedule.  It was a pleasure to meet you today! Please don't hesitate to contact me with any questions. Welcome to [facility]!

## 2024-03-26 NOTE — Assessment & Plan Note (Signed)
 EMR reviewed briefly.

## 2024-03-26 NOTE — Assessment & Plan Note (Signed)
 Paternal grandmother.  She tested negative for BRCA.  Order placed for mammogram.

## 2024-03-26 NOTE — Assessment & Plan Note (Signed)
 Smoking cessation instruction/counseling given:  counseled patient on the dangers of tobacco use, advised patient to stop smoking, and reviewed strategies to maximize success

## 2024-03-26 NOTE — Progress Notes (Signed)
 New Patient Office Visit  Subjective    Patient ID: Danielle Schneider, female    DOB: Feb 29, 1981  Age: 43 y.o. MRN: [id number]  CC:  Chief Complaint  Patient presents with   New Patient (Initial Visit)    Previously saw Dr. Thurston Flow here at [facility], since she left has not had PCP    HPI Danielle Schneider is a 43 y.o. female presents to establish care, for complete physical and follow up of chronic conditions.  Last pcp/physical/labs: Dr. Thurston Flow.   Immunizations: -Tetanus: Completed in within last 10 years ago.  -Influenza: completed at work.   Diet: Fair diet.  Exercise: regular exercise. 3-6 days a week. Floor exercise and walking.  Eye exam: Completes every two years ago. Dental exam: Completes annually    Pap Smear: Completed in 2019; established with GYN.  Mammogram: never; order placed.  Outpatient Encounter Medications as of 03/26/2024  Medication Sig   influenza vac split quadrivalent PF (FLUARIX  QUADRIVALENT) 0.5 ML injection Inject into the muscle. (Patient not taking: Reported on 03/26/2024)   No facility-administered encounter medications on file as of 03/26/2024.    Past Medical History:  Diagnosis Date   Abnormal Pap smear    Anal sphincter incontinence after fourth degree tear   resolved as of 2012   Family history of breast cancer    History of nephrolithiasis 02/2011   Panic attack    history of    Past Surgical History:  Procedure Laterality Date   CESAREAN SECTION     post partum sterilization     rib cage  2006   rib cage bone removal   SHOULDER OPEN ROTATOR CUFF REPAIR  2004   SHOULDER SURGERY     THORACIC OUTLET SURGERY      Family History  Problem Relation Age of Onset   Heart disease Father        MI in 32s   Hypertension Father    Diabetes Father    Stroke Father    Ovarian cysts Sister    Pancreatitis Maternal Grandfather    Cancer Maternal Grandfather        all over   Macular degeneration Paternal Grandmother    Breast cancer  Paternal Grandmother        dx before 32   Tuberculosis Paternal Grandfather    Esophageal cancer Paternal Grandfather    Diabetes Paternal Grandfather    BRCA 1/2 Sister        BRCA1 + (by report)   Cervical cancer Sister    Breast cancer Other        MGF's mother   Colon cancer Cousin        pat first cousin dx in his 23s    Social History   Socioeconomic History   Marital status: Married    Spouse name: Not on file   Number of children: Not on file   Years of education: Not on file   Highest education level: Not on file  Occupational History   Not on file  Tobacco Use   Smoking status: Every Day    Current packs/day: 0.50    Average packs/day: 0.5 packs/day for 2.5 years (1.2 ttl pk-yrs)    Types: Cigarettes    Start date: 2023    Last attempt to quit: 04/16/2008   Smokeless tobacco: Never   Tobacco comments:    Smokes 3-5 cigarettes.   Substance and Sexual Activity   Alcohol use: Yes    Comment: occasionally  Drug use: No   Sexual activity: Yes    Partners: Male    Birth control/protection: Surgical  Other Topics Concern   Not on file  Social History Narrative   Not on file   Social Drivers of Health   Financial Resource Strain: Not on file  Food Insecurity: Not on file  Transportation Needs: Not on file  Physical Activity: Not on file  Stress: Not on file  Social Connections: Not on file  Intimate Partner Violence: Not on file    Review of Systems  Constitutional:  Negative for chills, fever, malaise/fatigue and weight loss.  HENT:  Negative for congestion, ear discharge, ear pain, hearing loss, nosebleeds, sinus pain, sore throat and tinnitus.   Eyes:  Negative for blurred vision, double vision, pain, discharge and redness.  Respiratory:  Negative for cough, shortness of breath, wheezing and stridor.   Cardiovascular:  Negative for chest pain, palpitations and leg swelling.  Gastrointestinal:  Negative for abdominal pain, constipation, diarrhea,  heartburn, nausea and vomiting.  Genitourinary:  Negative for dysuria, frequency and urgency.  Musculoskeletal:  Negative for myalgias.  Skin:  Negative for rash.  Neurological:  Negative for dizziness, tingling, seizures, weakness and headaches.  Psychiatric/Behavioral:  Negative for depression, substance abuse and suicidal ideas. The patient is not nervous/anxious.         Objective    BP (!) 118/58   Pulse 63   Temp 98.6 F (37 C) (Temporal)   Ht 5' 6 (1.676 m)   Wt 161 lb (73 kg)   LMP 03/23/2024   SpO2 98%   BMI 25.99 kg/m   Physical Exam Vitals and nursing note reviewed.  Constitutional:      Appearance: Normal appearance.  HENT:     Head: Normocephalic and atraumatic.     Right Ear: Tympanic membrane, ear canal and external ear normal.     Left Ear: Tympanic membrane, ear canal and external ear normal.     Nose: Nose normal.     Mouth/Throat:     Mouth: Mucous membranes are moist.     Pharynx: Oropharynx is clear.   Eyes:     Conjunctiva/sclera: Conjunctivae normal.     Pupils: Pupils are equal, round, and reactive to light.    Cardiovascular:     Rate and Rhythm: Normal rate and regular rhythm.     Pulses: Normal pulses.     Heart sounds: Normal heart sounds.  Pulmonary:     Effort: Pulmonary effort is normal.     Breath sounds: Normal breath sounds.  Abdominal:     General: Abdomen is flat. Bowel sounds are normal.     Palpations: Abdomen is soft.   Musculoskeletal:        General: Normal range of motion.     Cervical back: Normal range of motion.   Skin:    General: Skin is warm and dry.     Capillary Refill: Capillary refill takes less than 2 seconds.   Neurological:     General: No focal deficit present.     Mental Status: She is alert and oriented to person, place, and time. Mental status is at baseline.   Psychiatric:        Mood and Affect: Mood normal.        Behavior: Behavior normal.        Thought Content: Thought content normal.         Judgment: Judgment normal.        Assessment &  Plan:  Encounter for screening and preventative care Assessment & Plan: Immunizations due; will get tdap at work. Pap smear due; she will schedule with gyn. Mammogram due, orders placed.  Discussed the importance of a healthy diet and regular exercise in order for weight loss, and to reduce the risk of further co-morbidity.  Exam stable. Labs pending.  Follow up in 1 year for repeat physical.   Orders: -     CBC -     Comprehensive metabolic panel with GFR; Future -     Lipid panel -     Hemoglobin A1c -     TSH  Family history of breast cancer Assessment & Plan: Paternal grandmother.  She tested negative for BRCA.  Order placed for mammogram.  Orders: -     3D Screening Mammogram, Left and Right; Future -     Lipid panel -     Hemoglobin A1c -     TSH  Encounter for screening mammogram for malignant neoplasm of breast Assessment & Plan: Order placed.  Orders: -     3D Screening Mammogram, Left and Right; Future  Need for hepatitis C screening test -     Hepatitis C antibody  Mixed hyperlipidemia Assessment & Plan: Repeat lipid panel pending.  Reviewed labs from November, 2024.  Discussed the importance of monitoring diet and exercise.   Smoker Assessment & Plan: Smoking cessation instruction/counseling given:  counseled patient on the dangers of tobacco use, advised patient to stop smoking, and reviewed strategies to maximize success    Encounter to establish care Assessment & Plan: EMR reviewed briefly.    Family history of colon cancer Assessment & Plan: Unsure.     Return in about 6 months (around 09/25/2024) for hyperlipidemia.   Jolanda Nation, NP

## 2024-03-27 ENCOUNTER — Ambulatory Visit: Payer: Self-pay | Admitting: General Practice

## 2024-03-27 LAB — LIPID PANEL
Cholesterol: 250 mg/dL — ABNORMAL HIGH (ref <200)
HDL: 89 mg/dL (ref >=50)
LDL Cholesterol (Calc): 144 mg/dL — ABNORMAL HIGH
Non-HDL Cholesterol (Calc): 161 mg/dL — ABNORMAL HIGH (ref <130)
Total CHOL/HDL Ratio: 2.8 (calc) (ref <5.0)
Triglycerides: 70 mg/dL (ref <150)

## 2024-03-27 LAB — CBC
HCT: 35.6 % (ref 35.0–45.0)
Hemoglobin: 11.4 g/dL — ABNORMAL LOW (ref 11.7–15.5)
MCH: 29.6 pg (ref 27.0–33.0)
MCHC: 32 g/dL (ref 32.0–36.0)
MCV: 92.5 fL (ref 80.0–100.0)
MPV: 12.2 fL (ref 7.5–12.5)
Platelets: 221 10*3/uL (ref 140–400)
RBC: 3.85 10*6/uL (ref 3.80–5.10)
RDW: 11.8 % (ref 11.0–15.0)
WBC: 4.3 10*3/uL (ref 3.8–10.8)

## 2024-03-27 LAB — HEPATITIS C ANTIBODY: Hepatitis C Ab: NONREACTIVE

## 2024-03-27 LAB — HEMOGLOBIN A1C
Hgb A1c MFr Bld: 5.1 % (ref <5.7)
Mean Plasma Glucose: 100 mg/dL
eAG (mmol/L): 5.5 mmol/L

## 2024-03-27 LAB — TSH: TSH: 1.1 m[IU]/L

## 2024-09-17 ENCOUNTER — Encounter: Payer: Self-pay | Admitting: General Practice
